# Patient Record
Sex: Male | Born: 1980 | Race: Black or African American | Hispanic: No | Marital: Married | Smoking: Former smoker
Health system: Southern US, Community
[De-identification: ages and names within clinical notes are randomized; demographics above are authoritative.]

## PROBLEM LIST (undated history)

## (undated) DIAGNOSIS — J302 Other seasonal allergic rhinitis: Secondary | ICD-10-CM

## (undated) DIAGNOSIS — T7840XA Allergy, unspecified, initial encounter: Secondary | ICD-10-CM

## (undated) DIAGNOSIS — B019 Varicella without complication: Secondary | ICD-10-CM

## (undated) DIAGNOSIS — R011 Cardiac murmur, unspecified: Secondary | ICD-10-CM

## (undated) DIAGNOSIS — J45909 Unspecified asthma, uncomplicated: Secondary | ICD-10-CM

## (undated) DIAGNOSIS — I10 Essential (primary) hypertension: Secondary | ICD-10-CM

## (undated) DIAGNOSIS — R002 Palpitations: Secondary | ICD-10-CM

## (undated) DIAGNOSIS — R0600 Dyspnea, unspecified: Secondary | ICD-10-CM

## (undated) DIAGNOSIS — R7303 Prediabetes: Secondary | ICD-10-CM

## (undated) DIAGNOSIS — I499 Cardiac arrhythmia, unspecified: Secondary | ICD-10-CM

## (undated) DIAGNOSIS — I671 Cerebral aneurysm, nonruptured: Secondary | ICD-10-CM

## (undated) HISTORY — DX: Dyspnea, unspecified: R06.00

## (undated) HISTORY — DX: Unspecified asthma, uncomplicated: J45.909

## (undated) HISTORY — DX: Essential (primary) hypertension: I10

## (undated) HISTORY — DX: Cardiac murmur, unspecified: R01.1

## (undated) HISTORY — PX: DENTAL SURGERY: SHX609

## (undated) HISTORY — DX: Palpitations: R00.2

## (undated) HISTORY — DX: Cardiac arrhythmia, unspecified: I49.9

## (undated) HISTORY — DX: Prediabetes: R73.03

## (undated) HISTORY — DX: Allergy, unspecified, initial encounter: T78.40XA

## (undated) HISTORY — DX: Varicella without complication: B01.9

## (undated) HISTORY — DX: Cerebral aneurysm, nonruptured: I67.1

---

## 2004-10-04 ENCOUNTER — Emergency Department (HOSPITAL_COMMUNITY): Admission: EM | Admit: 2004-10-04 | Discharge: 2004-10-04 | Payer: Self-pay | Admitting: Emergency Medicine

## 2009-03-09 ENCOUNTER — Emergency Department (HOSPITAL_COMMUNITY): Admission: EM | Admit: 2009-03-09 | Discharge: 2009-03-09 | Payer: Self-pay | Admitting: Emergency Medicine

## 2009-03-12 ENCOUNTER — Emergency Department (HOSPITAL_COMMUNITY): Admission: EM | Admit: 2009-03-12 | Discharge: 2009-03-12 | Payer: Self-pay | Admitting: Emergency Medicine

## 2009-04-20 ENCOUNTER — Emergency Department (HOSPITAL_COMMUNITY): Admission: EM | Admit: 2009-04-20 | Discharge: 2009-04-21 | Payer: Self-pay | Admitting: Emergency Medicine

## 2009-05-09 ENCOUNTER — Ambulatory Visit (HOSPITAL_COMMUNITY): Admission: RE | Admit: 2009-05-09 | Discharge: 2009-05-09 | Payer: Self-pay | Admitting: Urology

## 2010-03-29 IMAGING — CT CT PELVIS W/O CM
2 of 4 series · 17 of 46 positions shown, 19 images · non-contrast
Comparison: None available.

CT ABDOMEN

CLINICAL DATA: Right flank pain, nausea.

CT ABDOMEN AND PELVIS WITHOUT CONTRAST
TECHNIQUE: Multidetector CT imaging of the abdomen and pelvis was
performed following the standard protocol without intravenous
contrast.

[Series 2: stone_wo 5.0 b40f st · axial · 0.74mm/px · z∈[-482,-70]mm · 14 of 113 slices shown, 16 images]
[im 5/113  soft-tissue]
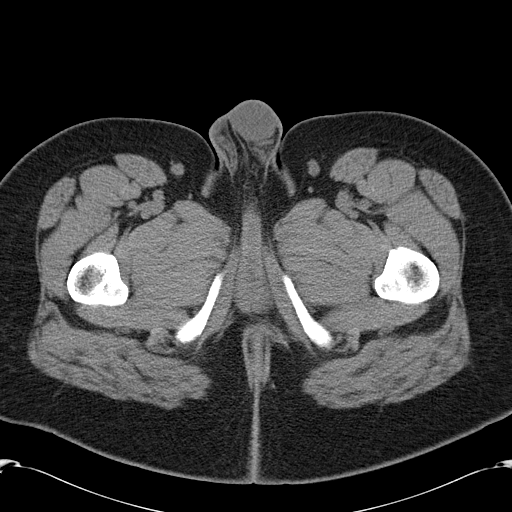
[im 5/113  bone]
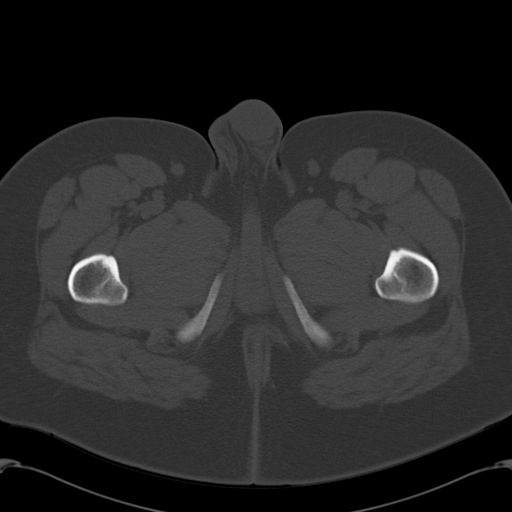
[im 15/113  soft-tissue]
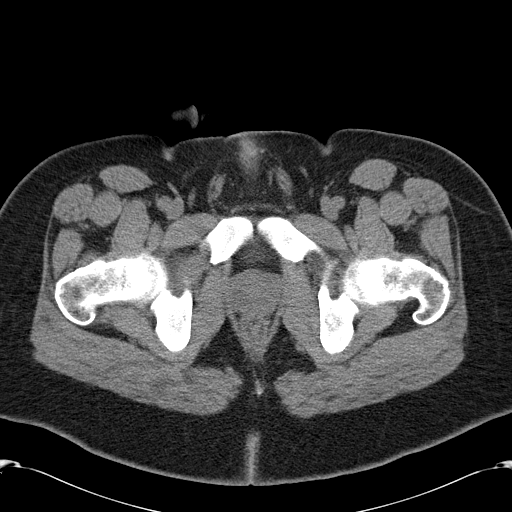
[im 24/113  soft-tissue]
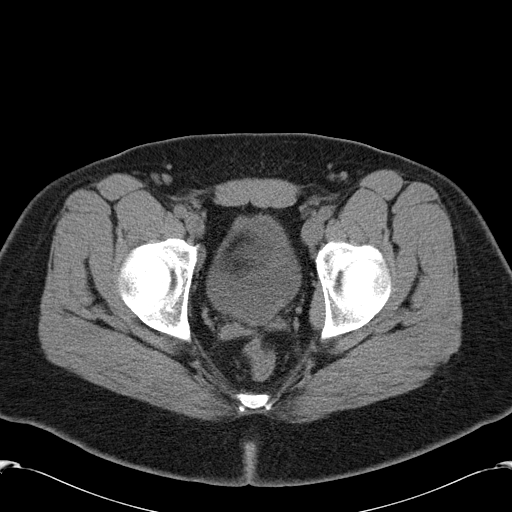
[im 29/113  soft-tissue]
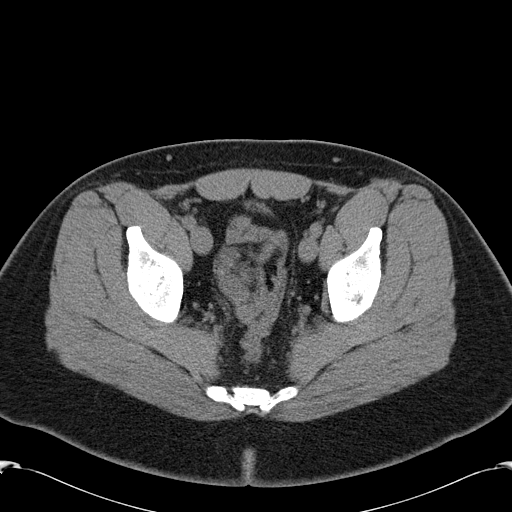
[im 38/113  soft-tissue]
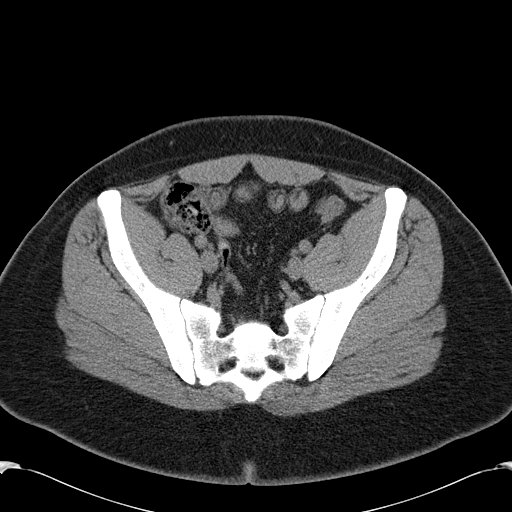
[im 47/113  soft-tissue]
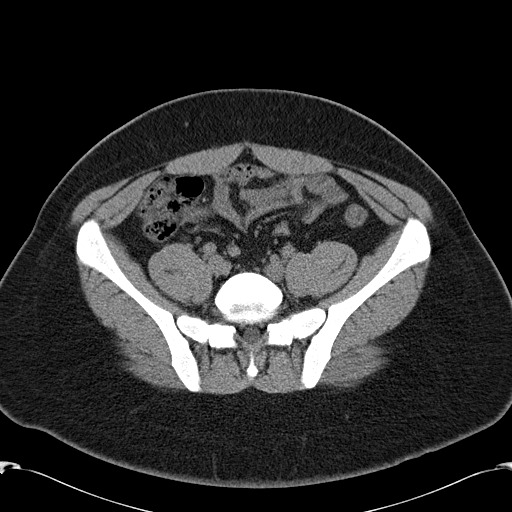
[im 52/113  soft-tissue]
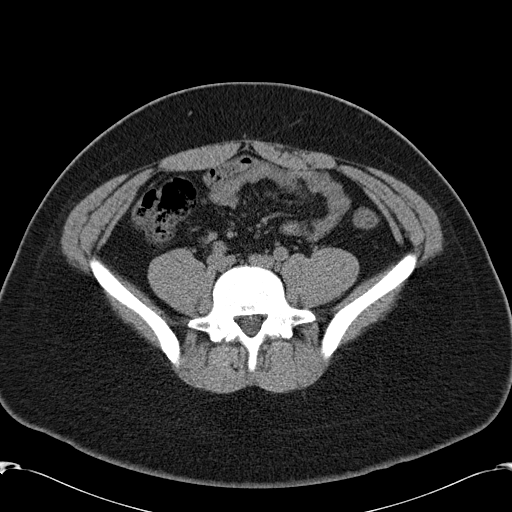
[im 61/113  soft-tissue]
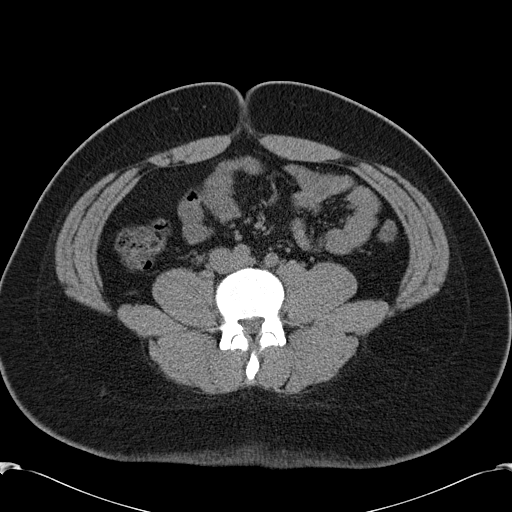
[im 66/113  soft-tissue]
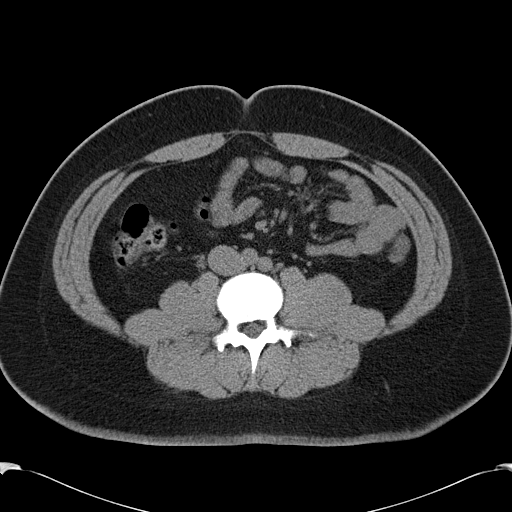
[im 66/113  bone]
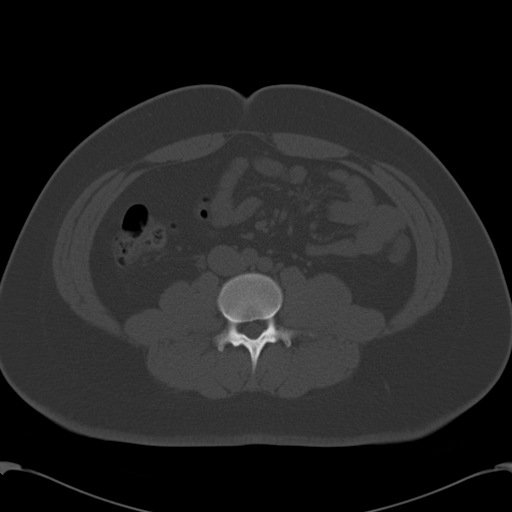
[im 75/113  soft-tissue]
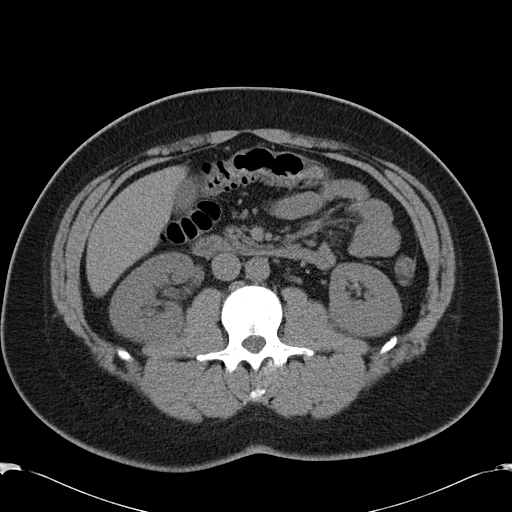
[im 85/113  soft-tissue]
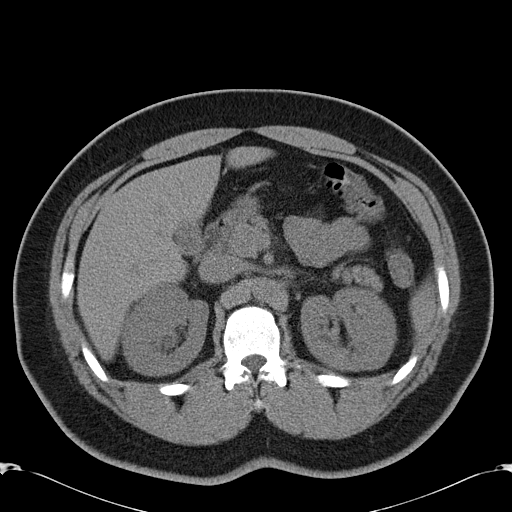
[im 89/113  soft-tissue]
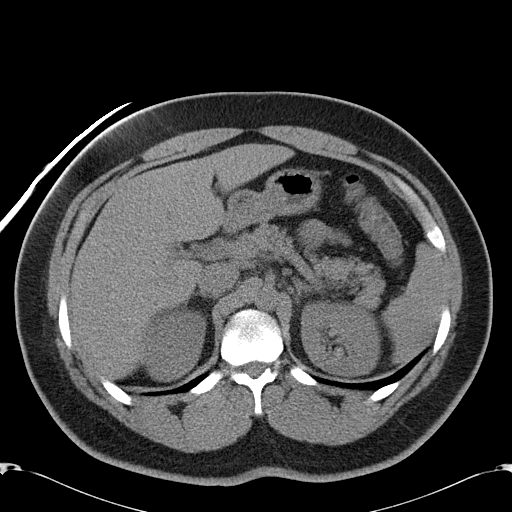
[im 99/113  soft-tissue]
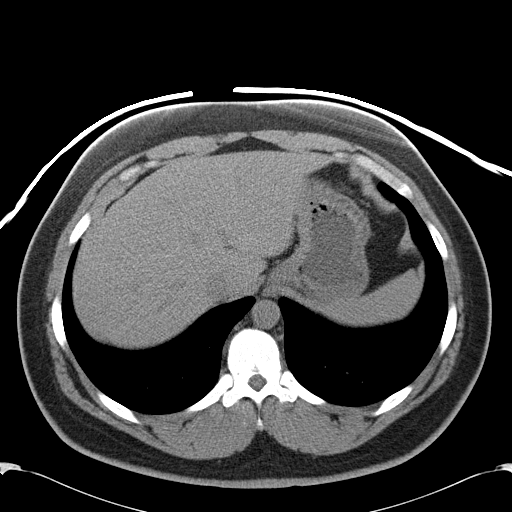
[im 108/113  soft-tissue]
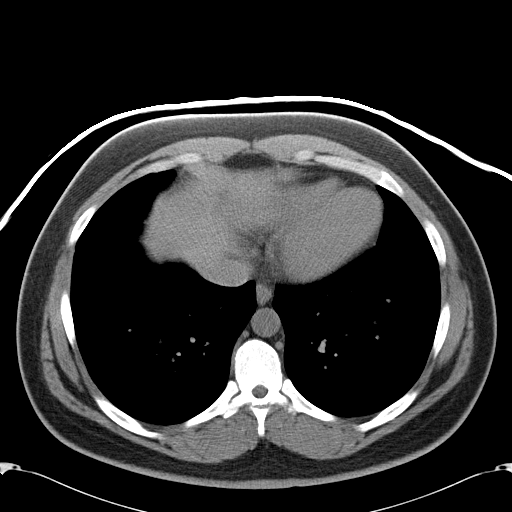

[Series 602: coronal · coronal · 0.92mm/px · 3 of 71 slices shown]
[im 24/71  soft-tissue]
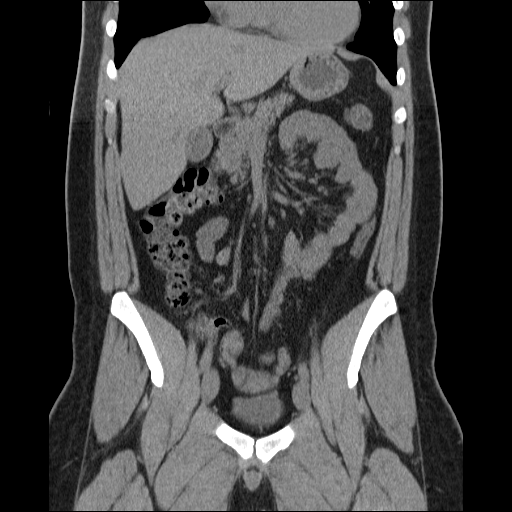
[im 32/71  soft-tissue]
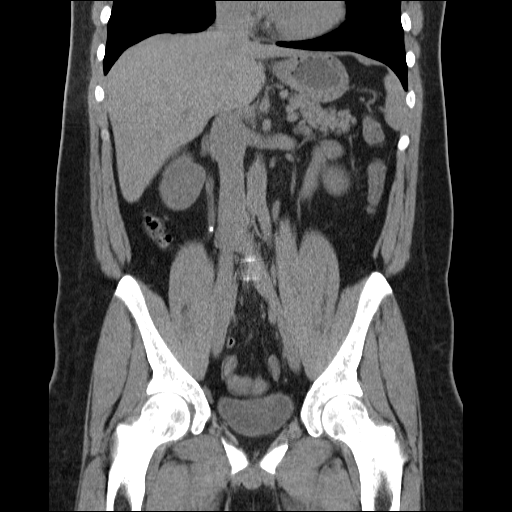
[im 39/71  soft-tissue]
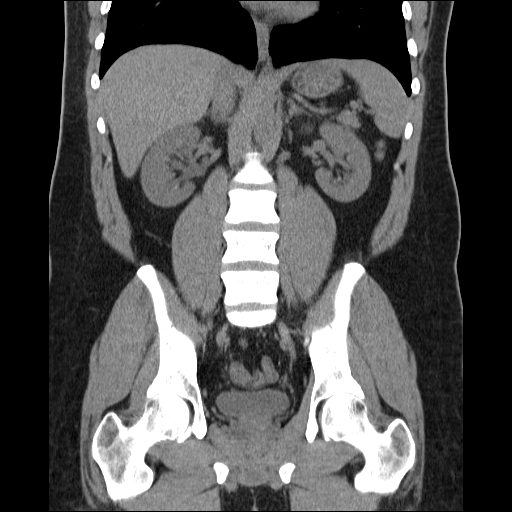

[17 of 46 positions shown; findings below may reference images not displayed]

FINDINGS: The lung bases are clear.  There is no pleural or
pericardial effusion.

There is a stone in the proximal right ureter measuring 0.4 cm.
The stone is at the level of the L3-4 disc.  There is mild right
hydronephrosis.  No other stone is identified on the right or
left.  The patient has a bifid right renal pelvis.

The liver, gallbladder, biliary tree, adrenal glands, spleen and
pancreas all appear normal.  No abdominal lymphadenopathy or fluid.
No focal bony abnormality.
IMPRESSION: Study is positive for a proximal right ureteral stone is associated
mild right hydronephrosis.

CT PELVIS
FINDINGS: No distal ureteral or urinary bladder stones are
identified.  The prostate gland, seminal vesicles and urinary
bladder all appear normal.  The colon and appendix are
unremarkable.  No pelvic fluid or lymphadenopathy.  No focal bony
abnormality.
IMPRESSION: No acute finding in the pelvis.

## 2011-02-09 LAB — CBC
HCT: 42.6 % (ref 39.0–52.0)
Hemoglobin: 14.4 g/dL (ref 13.0–17.0)
MCHC: 33.9 g/dL (ref 30.0–36.0)
MCV: 93.8 fL (ref 78.0–100.0)
Platelets: 249 K/uL (ref 150–400)
RBC: 4.54 MIL/uL (ref 4.22–5.81)
RDW: 13.9 % (ref 11.5–15.5)
WBC: 16.5 K/uL — ABNORMAL HIGH (ref 4.0–10.5)

## 2011-02-09 LAB — URINE MICROSCOPIC-ADD ON

## 2011-02-09 LAB — BASIC METABOLIC PANEL WITH GFR
Calcium: 9.6 mg/dL (ref 8.4–10.5)
GFR calc Af Amer: >60 mL/min (ref >60)
GFR calc non Af Amer: >60 mL/min (ref >60)
Glucose, Bld: 96 mg/dL (ref 70–99)
Potassium: 3.4 meq/L — ABNORMAL LOW (ref 3.5–5.1)
Sodium: 138 meq/L (ref 135–145)

## 2011-02-09 LAB — DIFFERENTIAL
Basophils Absolute: 0 10*3/uL (ref 0.0–0.1)
Basophils Relative: 0 % (ref 0–1)
Eosinophils Absolute: 0.1 K/uL (ref 0.0–0.7)
Eosinophils Relative: 0 % (ref 0–5)
Lymphocytes Relative: 10 % — ABNORMAL LOW (ref 12–46)
Lymphs Abs: 1.7 10*3/uL (ref 0.7–4.0)
Monocytes Absolute: 1.4 10*3/uL — ABNORMAL HIGH (ref 0.1–1.0)
Monocytes Relative: 9 % (ref 3–12)
Neutro Abs: 13.3 10*3/uL — ABNORMAL HIGH (ref 1.7–7.7)
Neutrophils Relative %: 81 % — ABNORMAL HIGH (ref 43–77)

## 2011-02-09 LAB — URINALYSIS, ROUTINE W REFLEX MICROSCOPIC
Bilirubin Urine: NEGATIVE
Glucose, UA: NEGATIVE mg/dL
Hgb urine dipstick: NEGATIVE
Leukocytes, UA: NEGATIVE
Nitrite: NEGATIVE
Protein, ur: 30 mg/dL — AB
Specific Gravity, Urine: 1.03 (ref 1.005–1.030)
Urobilinogen, UA: 1 mg/dL (ref 0.0–1.0)
pH: 8 (ref 5.0–8.0)

## 2011-02-09 LAB — BASIC METABOLIC PANEL
BUN: 10 mg/dL (ref 6–23)
CO2: 26 mEq/L (ref 19–32)
Chloride: 103 mEq/L (ref 96–112)
Creatinine, Ser: 1.32 mg/dL (ref 0.4–1.5)

## 2011-02-09 LAB — LIPASE, BLOOD: Lipase: 21 U/L (ref 11–59)

## 2011-02-10 LAB — URINALYSIS, ROUTINE W REFLEX MICROSCOPIC
Glucose, UA: NEGATIVE mg/dL
Ketones, ur: >80 mg/dL — AB
Nitrite: NEGATIVE
Specific Gravity, Urine: 1.019 (ref 1.005–1.030)
Urobilinogen, UA: 0.2 mg/dL (ref 0.0–1.0)
pH: 6.5 (ref 5.0–8.0)
pH: 8 (ref 5.0–8.0)

## 2011-02-10 LAB — DIFFERENTIAL
Basophils Absolute: 0.1 10*3/uL (ref 0.0–0.1)
Basophils Relative: 1 % (ref 0–1)
Eosinophils Absolute: 0 10*3/uL (ref 0.0–0.7)
Eosinophils Relative: 0 % (ref 0–5)
Lymphocytes Relative: 13 % (ref 12–46)
Lymphs Abs: 1.8 10*3/uL (ref 0.7–4.0)
Neutro Abs: 10.1 10*3/uL — ABNORMAL HIGH (ref 1.7–7.7)
Neutrophils Relative %: 84 % — ABNORMAL HIGH (ref 43–77)

## 2011-02-10 LAB — POCT I-STAT, CHEM 8
HCT: 44 % (ref 39.0–52.0)
Hemoglobin: 15 g/dL (ref 13.0–17.0)
Potassium: 4.2 mEq/L (ref 3.5–5.1)
Sodium: 141 mEq/L (ref 135–145)

## 2011-02-10 LAB — URINE MICROSCOPIC-ADD ON

## 2011-02-10 LAB — CBC
HCT: 43.2 % (ref 39.0–52.0)
Hemoglobin: 15.1 g/dL (ref 13.0–17.0)
MCHC: 34.7 g/dL (ref 30.0–36.0)
MCV: 94.3 fL (ref 78.0–100.0)
Platelets: 245 10*3/uL (ref 150–400)
Platelets: 249 10*3/uL (ref 150–400)
RBC: 4.61 MIL/uL (ref 4.22–5.81)
WBC: 13.1 10*3/uL — ABNORMAL HIGH (ref 4.0–10.5)

## 2011-02-10 LAB — BASIC METABOLIC PANEL
BUN: 9 mg/dL (ref 6–23)
GFR calc Af Amer: >60 mL/min (ref >60)
GFR calc non Af Amer: >60 mL/min (ref >60)
Potassium: 3.5 mEq/L (ref 3.5–5.1)
Sodium: 140 mEq/L (ref 135–145)

## 2014-10-04 ENCOUNTER — Emergency Department (HOSPITAL_COMMUNITY): Payer: BC Managed Care – PPO

## 2014-10-04 ENCOUNTER — Emergency Department (HOSPITAL_COMMUNITY)
Admission: EM | Admit: 2014-10-04 | Discharge: 2014-10-04 | Disposition: A | Discharge status: Home / Self Care | Payer: BC Managed Care – PPO | Attending: Emergency Medicine | Admitting: Emergency Medicine

## 2014-10-04 ENCOUNTER — Encounter (HOSPITAL_COMMUNITY): Payer: Self-pay | Admitting: Emergency Medicine

## 2014-10-04 DIAGNOSIS — Z87891 Personal history of nicotine dependence: Secondary | ICD-10-CM | POA: Diagnosis not present

## 2014-10-04 DIAGNOSIS — Z8679 Personal history of other diseases of the circulatory system: Secondary | ICD-10-CM | POA: Diagnosis not present

## 2014-10-04 DIAGNOSIS — Z87442 Personal history of urinary calculi: Secondary | ICD-10-CM | POA: Diagnosis not present

## 2014-10-04 DIAGNOSIS — R319 Hematuria, unspecified: Secondary | ICD-10-CM | POA: Insufficient documentation

## 2014-10-04 DIAGNOSIS — R52 Pain, unspecified: Secondary | ICD-10-CM

## 2014-10-04 DIAGNOSIS — J45909 Unspecified asthma, uncomplicated: Secondary | ICD-10-CM | POA: Insufficient documentation

## 2014-10-04 DIAGNOSIS — Z79899 Other long term (current) drug therapy: Secondary | ICD-10-CM | POA: Diagnosis not present

## 2014-10-04 DIAGNOSIS — R1030 Lower abdominal pain, unspecified: Secondary | ICD-10-CM | POA: Diagnosis present

## 2014-10-04 DIAGNOSIS — R102 Pelvic and perineal pain: Secondary | ICD-10-CM

## 2014-10-04 LAB — CBC WITH DIFFERENTIAL/PLATELET
BASOS ABS: 0 10*3/uL (ref 0.0–0.1)
BASOS PCT: 0 % (ref 0–1)
EOS PCT: 0 % (ref 0–5)
Eosinophils Absolute: 0 10*3/uL (ref 0.0–0.7)
HEMATOCRIT: 40 % (ref 39.0–52.0)
Hemoglobin: 13.6 g/dL (ref 13.0–17.0)
LYMPHS PCT: 14 % (ref 12–46)
Lymphs Abs: 1.4 10*3/uL (ref 0.7–4.0)
MCH: 30.7 pg (ref 26.0–34.0)
MCHC: 34 g/dL (ref 30.0–36.0)
MCV: 90.3 fL (ref 78.0–100.0)
MONO ABS: 0.5 10*3/uL (ref 0.1–1.0)
Monocytes Relative: 5 % (ref 3–12)
Neutro Abs: 8.1 10*3/uL — ABNORMAL HIGH (ref 1.7–7.7)
Neutrophils Relative %: 81 % — ABNORMAL HIGH (ref 43–77)
Platelets: 221 10*3/uL (ref 150–400)
RBC: 4.43 MIL/uL (ref 4.22–5.81)
RDW: 13.7 % (ref 11.5–15.5)
WBC: 10 10*3/uL (ref 4.0–10.5)

## 2014-10-04 LAB — URINALYSIS, ROUTINE W REFLEX MICROSCOPIC
BILIRUBIN URINE: NEGATIVE
Glucose, UA: NEGATIVE mg/dL
KETONES UR: 15 mg/dL — AB
LEUKOCYTES UA: NEGATIVE
NITRITE: NEGATIVE
PROTEIN: NEGATIVE mg/dL
Specific Gravity, Urine: 1.02 (ref 1.005–1.030)
UROBILINOGEN UA: 1 mg/dL (ref 0.0–1.0)
pH: 7.5 (ref 5.0–8.0)

## 2014-10-04 LAB — COMPREHENSIVE METABOLIC PANEL
ALT: 17 U/L (ref 0–53)
ANION GAP: 12 (ref 5–15)
AST: 20 U/L (ref 0–37)
Albumin: 4 g/dL (ref 3.5–5.2)
Alkaline Phosphatase: 70 U/L (ref 39–117)
BILIRUBIN TOTAL: 0.9 mg/dL (ref 0.3–1.2)
BUN: 14 mg/dL (ref 6–23)
CALCIUM: 9.3 mg/dL (ref 8.4–10.5)
CHLORIDE: 105 meq/L (ref 96–112)
CO2: 25 mEq/L (ref 19–32)
CREATININE: 0.91 mg/dL (ref 0.50–1.35)
GFR calc Af Amer: >90 mL/min (ref >90)
GFR calc non Af Amer: >90 mL/min (ref >90)
Glucose, Bld: 109 mg/dL — ABNORMAL HIGH (ref 70–99)
Potassium: 4 mEq/L (ref 3.7–5.3)
Sodium: 142 mEq/L (ref 137–147)
TOTAL PROTEIN: 7.8 g/dL (ref 6.0–8.3)

## 2014-10-04 LAB — URINE MICROSCOPIC-ADD ON

## 2014-10-04 MED ORDER — ONDANSETRON HCL 4 MG/2ML IJ SOLN
4.0000 mg | Freq: Once | INTRAMUSCULAR | Status: AC
  Administered 2014-10-04: 4 mg via INTRAVENOUS
  Filled 2014-10-04: qty 2

## 2014-10-04 MED ORDER — IOHEXOL 300 MG/ML  SOLN
100.0000 mL | Freq: Once | INTRAMUSCULAR | Status: AC | PRN
  Administered 2014-10-04: 100 mL via INTRAVENOUS

## 2014-10-04 MED ORDER — IBUPROFEN 800 MG PO TABS: 800.0000 mg | ORAL_TABLET | Freq: Three times a day (TID) | ORAL | Status: DC

## 2014-10-04 MED ORDER — SODIUM CHLORIDE 0.9 % IV BOLUS (SEPSIS)
1000.0000 mL | Freq: Once | INTRAVENOUS | Status: AC
  Administered 2014-10-04: 1000 mL via INTRAVENOUS

## 2014-10-04 MED ORDER — IOHEXOL 300 MG/ML  SOLN
50.0000 mL | Freq: Once | INTRAMUSCULAR | Status: AC | PRN
  Administered 2014-10-04: 50 mL via ORAL

## 2014-10-04 MED ORDER — HYDROMORPHONE HCL 1 MG/ML IJ SOLN
0.5000 mg | Freq: Once | INTRAMUSCULAR | Status: AC
Start: 2014-10-04 — End: 2014-10-04
  Administered 2014-10-04: 0.5 mg via INTRAVENOUS
  Filled 2014-10-04: qty 1

## 2014-10-04 NOTE — ED Provider Notes (Signed)
CSN: [id number]     Arrival date & time 10/04/14  [id number] History   First MD Initiated Contact with Patient 10/04/14 0449     Chief Complaint  Patient presents with  . Abdominal Pain     (Consider location/radiation/quality/duration/timing/severity/associated sxs/prior Treatment) HPI Mr. Joshua Galloway is a 33 year old male with past medical history of asthma, previous kidney stones who presents the ER complaining of abdominal pain. Patient states his pain began acutely around 1:00 AM this morning. Patient states pain has been "off and on" since then. He describes his pain in his lower abdomen, does not radiate. Patient states his pain was more severe last night, and has since lessened in severity. Patient states right now during the interview his pain as a 4 out of 10. Patient reports one episode of left-sided flank pain throughout the night as well. Patient reports mild nausea throughout the night, however denies vomiting. Patient denies associated chest pain, shortness of breath, dizziness, weakness, diarrhea, dysuria, testicular pain, testicular swelling.  Past Medical History  Diagnosis Date  . Irregular heart rate   . Asthma   . Palpitations   . Chest pain   . Dyspnea    History reviewed. No pertinent past surgical history. Family History  Problem Relation Age of Onset  . Heart attack Father    History  Substance Use Topics  . Smoking status: Former Smoker    Quit date: 03/25/2009  . Smokeless tobacco: Not on file  . Alcohol Use: Yes     Comment: rarely     Review of Systems  Constitutional: Negative for fever.  HENT: Negative for trouble swallowing.   Eyes: Negative for visual disturbance.  Respiratory: Negative for shortness of breath.   Cardiovascular: Negative for chest pain.  Gastrointestinal: Positive for nausea and abdominal pain. Negative for vomiting.  Genitourinary: Negative for dysuria, discharge, penile swelling, scrotal swelling, penile pain and testicular  pain.  Musculoskeletal: Negative for neck pain.  Skin: Negative for rash.  Neurological: Negative for dizziness, weakness and numbness.  Psychiatric/Behavioral: Negative.       Allergies  Review of patient's allergies indicates no known allergies.  Home Medications   Prior to Admission medications   Medication Sig Start Date End Date Taking? Authorizing Provider  acetaminophen (TYLENOL) 325 MG tablet Take 325 mg by mouth every 6 (six) hours as needed for headache.   Yes Historical Provider, MD  Ephedrine-Guaifenesin (PRIMATENE ASTHMA) 12.5-200 MG TABS Take 2 tablets by mouth daily as needed (SOB).   Yes Historical Provider, MD  OVER THE COUNTER MEDICATION Take 1 each by mouth daily.   Yes Historical Provider, MD  ibuprofen (ADVIL,MOTRIN) 800 MG tablet Take 1 tablet (800 mg total) by mouth 3 (three) times daily. 10/04/14   Courtney A Forcucci, PA-C   BP 125/72 mmHg  Pulse 78  Temp(Src) 97.5 F (36.4 C) (Oral)  Resp 16  Ht 5\' 11"  (1.803 m)  Wt 230 lb (104.327 kg)  BMI 32.09 kg/m2  SpO2 96% Physical Exam  Constitutional: He is oriented to person, place, and time. He appears well-developed and well-nourished. No distress.  HENT:  Head: Normocephalic and atraumatic.  Mouth/Throat: Oropharynx is clear and moist. No oropharyngeal exudate.  Eyes: Right eye exhibits no discharge. Left eye exhibits no discharge. No scleral icterus.  Neck: Normal range of motion.  Cardiovascular: Normal rate, regular rhythm and normal heart sounds.   No murmur heard. Pulmonary/Chest: Effort normal and breath sounds normal. No respiratory distress.  Abdominal: Soft. Normal appearance and  bowel sounds are normal. There is tenderness in the suprapubic area. There is no rigidity, no guarding, no tenderness at McBurney's point and negative Murphy's sign.  Mild suprapubic tenderness right greater than left.  Genitourinary: Testes normal and penis normal. Right testis shows no mass, no swelling and no  tenderness. Right testis is descended. Cremasteric reflex is not absent on the right side. Left testis shows no mass, no swelling and no tenderness. Left testis is descended. Cremasteric reflex is not absent on the left side.  Musculoskeletal: Normal range of motion. He exhibits no edema or tenderness.  Neurological: He is alert and oriented to person, place, and time. No cranial nerve deficit. Coordination normal. GCS eye subscore is 4. GCS verbal subscore is 5. GCS motor subscore is 6.  Patient fully alert answering questions appropriately in full, clear sentences. Motor strength 5 out of 5 in all major muscle groups of upper and lower extremity. Cranial nerves II through XII grossly intact. Distal sensation intact.  Skin: Skin is warm and dry. No rash noted. He is not diaphoretic.  Psychiatric: He has a normal mood and affect.    ED Course  Procedures (including critical care time) Labs Review Labs Reviewed  CBC WITH DIFFERENTIAL - Abnormal; Notable for the following:    Neutrophils Relative % 81 (*)    Neutro Abs 8.1 (*)    All other components within normal limits  URINALYSIS, ROUTINE W REFLEX MICROSCOPIC - Abnormal; Notable for the following:    Hgb urine dipstick LARGE (*)    Ketones, ur 15 (*)    All other components within normal limits  COMPREHENSIVE METABOLIC PANEL - Abnormal; Notable for the following:    Glucose, Bld 109 (*)    All other components within normal limits  URINE MICROSCOPIC-ADD ON    Imaging Review Ct Abdomen Pelvis W Contrast  10/04/2014   CLINICAL DATA:  Right lower quadrant pain  EXAM: CT ABDOMEN AND PELVIS WITH CONTRAST  TECHNIQUE: Multidetector CT imaging of the abdomen and pelvis was performed using the standard protocol following bolus administration of intravenous contrast.  CONTRAST:  OMNIPAQUE IOHEXOL 300 MG/ML  SOLN  COMPARISON:  CT abdomen pelvis 04/20/2009  FINDINGS: Lung bases are clear.  Heart size is normal.  8 mm hypodensity in the left  lobe liver is stable from the prior study. No other liver lesion. Gallbladder and bile ducts are normal. Pancreas and spleen are normal.  Normal kidneys. No renal obstruction or mass. No urinary tract calculi. Urine bladder normal. Mild prostate enlargement.  Negative for bowel obstruction. No bowel thickening. Appendix normal. Scattered ileocolic lymph nodes are present measuring up to 10 mm in diameter. Negative for diverticulitis. Mild sigmoid diverticulosis.  IMPRESSION: Normal appendix.  Mild diverticulosis  Mild ileocolic  adenopathy.   Electronically Signed   By: Marlan Palau M.D.   On: 10/04/2014 07:18     EKG Interpretation None      MDM   Final diagnoses:  Suprapubic pain  Pain  Hematuria   Workup for suprapubic pain. Patient reporting he has had kidney stones in the past, however patient's pain is suprapubic and mildly reproducible. Patient's last CT abdomen pelvis was in 2010, we will repeat CT abdomen and pelvis today due to the nature of patient's pain and the length of time since his last one. Patient stating the pain he experienced with his kidney stones is much more severe. Patient nontoxic, well-appearing, and in no acute distress. Patient states his pain is  a 4 out of 10.  Hematuria noted on UA. No leukocytosis, anemia, electrolyte abnormality. Renal function within normal limits. Will follow-up with CT abdomen pelvis due to mild right lower quadrant abdominal tenderness. No concern for testicular torsion, testicular pathology contributing to patient's lower abdominal pain. GU exam benign.  7:00 AM: Plan to f/u on CT abd pelv.  Pt signed out do Delta Air LinesCourtney Forcucci, PA-C.    BP 125/72 mmHg  Pulse 78  Temp(Src) 97.5 F (36.4 C) (Oral)  Resp 16  Ht 5\' 11"  (1.803 m)  Wt 230 lb (104.327 kg)  BMI 32.09 kg/m2  SpO2 96%  Signed,  Ladona MowJoe Lauralee Waters, PA-C 6:43 AM   Monte FantasiaJoseph W Sargent Mankey, PA-C 10/05/14 [id number]  Doug SouSam Jacubowitz, MD 10/05/14 [phone]

## 2014-10-04 NOTE — Discharge Instructions (Signed)
Abdominal Pain Many things can cause belly (abdominal) pain. Most times, the belly pain is not dangerous. Many cases of belly pain can be watched and treated at home. HOME CARE   Do not take medicines that help you go poop (laxatives) unless told to by your doctor.  Only take medicine as told by your doctor.  Eat or drink as told by your doctor. Your doctor will tell you if you should be on a special diet. GET HELP IF:  You do not know what is causing your belly pain.  You have belly pain while you are sick to your stomach (nauseous) or have runny poop (diarrhea).  You have pain while you pee or poop.  Your belly pain wakes you up at night.  You have belly pain that gets worse or better when you eat.  You have belly pain that gets worse when you eat fatty foods.  You have a fever. GET HELP RIGHT AWAY IF:   The pain does not go away within 2 hours.  You keep throwing up (vomiting).  The pain changes and is only in the right or left part of the belly.  You have bloody or tarry looking poop. MAKE SURE YOU:   Understand these instructions.  Will watch your condition.  Will get help right away if you are not doing well or get worse. Document Released: 04/06/2008 Document Revised: 10/24/2013 Document Reviewed: 06/28/2013 [facility] Patient Information 2015 [place], [place]. This information is not intended to replace advice given to you by your health care provider. Make sure you discuss any questions you have with your health care provider.  Hematuria Hematuria is blood in your urine. It can be caused by a bladder infection, kidney infection, prostate infection, kidney stone, or cancer of your urinary tract. Infections can usually be treated with medicine, and a kidney stone usually will pass through your urine. If neither of these is the cause of your hematuria, further workup to find out the reason may be needed. It is very important that you tell your health care provider  about any blood you see in your urine, even if the blood stops without treatment or happens without causing pain. Blood in your urine that happens and then stops and then happens again can be a symptom of a very serious condition. Also, pain is not a symptom in the initial stages of many urinary cancers. HOME CARE INSTRUCTIONS   Drink lots of fluid, 3-4 quarts a day. If you have been diagnosed with an infection, cranberry juice is especially recommended, in addition to large amounts of water.  Avoid caffeine, tea, and carbonated beverages because they tend to irritate the bladder.  Avoid alcohol because it may irritate the prostate.  Take all medicines as directed by your health care provider.  If you were prescribed an antibiotic medicine, finish it all even if you start to feel better.  If you have been diagnosed with a kidney stone, follow your health care provider's instructions regarding straining your urine to catch the stone.  Empty your bladder often. Avoid holding urine for long periods of time.  After a bowel movement, women should cleanse front to back. Use each tissue only once.  Empty your bladder before and after sexual intercourse if you are a male. SEEK MEDICAL CARE IF:  You develop back pain.  You have a fever.  You have a feeling of sickness in your stomach (nausea) or vomiting.  Your symptoms are not better in 3 days.  Return sooner if you are getting worse. SEEK IMMEDIATE MEDICAL CARE IF:   You develop severe vomiting and are unable to keep the medicine down.  You develop severe back or abdominal pain despite taking your medicines.  You begin passing a large amount of blood or clots in your urine.  You feel extremely weak or faint, or you pass out. MAKE SURE YOU:   Understand these instructions.  Will watch your condition.  Will get help right away if you are not doing well or get worse. Document Released: 10/19/2005 Document Revised: 03/05/2014  Document Reviewed: 06/19/2013 [facility] Patient Information 2015 [place], [place]. This information is not intended to replace advice given to you by your health care provider. Make sure you discuss any questions you have with your health care provider.    Emergency Department Resource Guide 1) Find a Doctor and Pay Out of Pocket Although you won't have to find out who is covered by your insurance plan, it is a good idea to ask around and get recommendations. You will then need to call the office and see if the doctor you have chosen will accept you as a new patient and what types of options they offer for patients who are self-pay. Some doctors offer discounts or will set up payment plans for their patients who do not have insurance, but you will need to ask so you aren't surprised when you get to your appointment.  2) Contact Your Local Health Department Not all health departments have doctors that can see patients for sick visits, but many do, so it is worth a call to see if yours does. If you don't know where your local health department is, you can check in your phone book. The CDC also has a tool to help you locate your state's health department, and many state websites also have listings of all of their local health departments.  3) Find a Walk-in Clinic If your illness is not likely to be very severe or complicated, you may want to try a walk in clinic. These are popping up all over the country in pharmacies, drugstores, and shopping centers. They're usually staffed by nurse practitioners or physician assistants that have been trained to treat common illnesses and complaints. They're usually fairly quick and inexpensive. However, if you have serious medical issues or chronic medical problems, these are probably not your best option.  No Primary Care Doctor: - Call Health Connect at  [phone] - they can help you locate a primary care doctor that  accepts your insurance, provides certain  services, etc. - Physician Referral Service- [phone]  Chronic Pain Problems: Organization         Address  Phone   Notes  [facility]  [phone] Patients need to be referred by their primary care doctor.   Medication Assistance: Organization         Address  Phone   Notes  [facility] [address] [place], [place] [address] [phone]) [phone] --Must be a resident of [facility] -- Must have NO insurance coverage whatsoever (no Medicaid/ Medicare, etc.) -- The pt. MUST have a primary care doctor that directs their care regularly and follows them in the community   MedAssist  [phone]   [employer]  [phone]    Agencies that provide inexpensive medical care: Organization         Address  Phone   Notes  [facility]  (  [phone]   [facility]    [phone]   [facility] [address] [place], [place] [phone]   [facility] [address] [place], [place] [phone]   Planned Parenthood    [phone]   [facility]    [phone]   [facility]  [address], [place] Phone:  [phone], Fax:  [phone] Hours of Operation:  9 am - 6 pm, M-F.  Also accepts Medicaid/Medicare and self-pay.  [facility]  [address], [place] Phone: [phone], Fax: [phone]. Hours of Operation:  8:30 am - 5:30 pm, M-F.  Also accepts Medicaid and self-pay.  [facility] [place] [address] [place] Phone: [phone]   [facility] [address] Natasha Bence [place], [place] [phone], Ext. [phone] Mondays & Thursdays: 7-9 AM.  First 15 patients are seen on a first come, first serve basis.    Medicaid-accepting [facility] Providers:  Organization         Address  Phone   Notes  [facility] [address], [place] [phone] Also accepts self-pay patients.  [facility] [address] Laurell Josephs [place], [place]  [phone]   [facility] [address], [place] [phone]   [facility] [address] [place] [phone]   Renaye Rakers [address], [place]   [phone] Only accepts [place] patients after they have their name applied to their card.   Self-Pay (no insurance) in [facility]:  Organization         Address  Phone   Notes  Sickle Cell Patients, [facility] [address] [place], [place] [phone]   [facility] [address] [place] [place], [place] [phone]   [facility] [place]  [address], [place] [phone]   [facility]/Dr. Osei-Bonsu  [address], [place] or [address], [place] [phone] Phone number for both [place] and [place] locations is the same.  [facility] [address] [place] [phone]   [facility] [address] [place] or [address] [phone]   [facility] [address], [place] [phone], phone; [phone], fax Sees patients 1st and 3rd Saturday of every month.  Must not qualify for public or private insurance (i.e. Medicaid, Medicare, [place] Health Choice, Veterans' Benefits)  Household income should be no more than 200% of the poverty level The clinic cannot treat you if you are pregnant or think you are pregnant  Sexually transmitted diseases are not treated at the clinic.    Dental Care: Organization         Address  Phone  Notes  [facility] of [facility] [address] [place], [place] [phone] Accepts  children up to age 14 who are enrolled in [place] or [place] Health Choice; pregnant women with a Medicaid card; and children who have applied for Medicaid or [place] Health Choice, but were declined, whose parents can pay a reduced fee at time of service.  [facility] Department of [employer]  [address], [place] [phone]) [phone] Accepts children up to age 34 who are enrolled in Medicaid or [place] Health Choice; pregnant women with a Medicaid card; and children who have applied for Medicaid or [place] Health Choice, but were declined, whose parents can pay a reduced fee at time of service.  [facility]  [address], [place] [phone] Patients are seen by appointment only. Walk-ins are not accepted. [facility] will see patients 34 years of age and older. Monday - Tuesday (8am-5pm) Most Wednesdays (8:30-5pm) $30 per visit, cash only  [facility]  [address], [facility] [place] [phone]) [phone] Patients are seen by appointment only. Walk-ins are not accepted. [facility] will see patients 34 years of age and older. One Wednesday Evening (Monthly: Volunteer Based).  $30 per visit, cash only  [employer] [facility] [facility]  [phone] for adults; Children under age 204, call Graduate Pediatric Dentistry at [phone]. Children aged 374-14, please call [phone] to request a pediatric application.  Dental services are provided in all areas of dental care including fillings, crowns and bridges, complete and partial dentures, implants, gum treatment, root canals, and extractions. Preventive care is also provided. Treatment is provided to both adults and children. Patients are selected via a lottery and there is often a waiting list.   [facility] [address], [place]  [phone]) [phone] [email or web address]   [facility] [address], [place], [place] [phone], Ext. [phone] Second and  Fourth Thursday of each month, opens at 6:30 AM; Clinic ends at 9 AM.  Patients are seen on a first-come first-served basis, and a limited number are seen during each clinic.   [facility]  [address], [place], [place] [phone]   Eligibility Requirements You must have lived in [place], [place], or [place] counties for at least the last three months.   You cannot be eligible for state or federal sponsored [place] Cityhealthcare insurance, including CIGNAVeterans Administration, [place], or [employer]Medicare.   You generally cannot be eligible for healthcare insurance through your employer.    How to apply: Eligibility screenings are held every Tuesday and Wednesday afternoon from 1:00 pm until 4:00 pm. You do not need an appointment for the interview!  [facility] [address], [place], [place] [phone]   [facility]  [phone]   [facility]  [phone]   [facility]  [phone]    Behavioral Health Resources in the Community: Intensive Outpatient Programs Organization         Address  Phone  Notes  [facility] [address], [place], [place] [phone]   [facility] [address], [place], [place] [phone]   [facility] [address], [place], [place]  [phone]   [facility] [address],  [place], [place] [phone] or [phone]   Substance Abuse Resources Organization         Address  Phone  Notes  [facility]  [phone]   [facility]  [phone]   [facility]  [phone]   [facility]  [phone]   Residential & Outpatient Substance Abuse Program  [phone]   Psychological Services Organization         Address  Phone  Notes  [facility]  [phone]- [phone]   [facility]  Services  [phone]   [facility]  [address], [place] [phone] or [phone]    Mobile Crisis Teams Organization         Address  Phone  Notes  [facility], Mobile Crisis Care Unit  [phone]   [facility]  [address] [place], [place] [phone]   Doristine Locks [address] [place] [phone]    Self-Help/Support Groups Organization         Address  Phone             Notes  [facility]oc[facility] of [place] - variety of support groups  [phone]- [phone] Call for more information  Narcotics Anonymous (NA), [facility] [address], [place]  2 meetings at this location   Statistician         Address  Phone  Notes  [facility] [address],    [place]  [phone]   [facility]  [address] [place] [address], [place], [place] [phone]   [facility] [address] [place], [place] [phone] Admissions: 8am-3pm M-F  [facility] [address],    [place], [place] [phone]   [facility] [address] [place], [place], [place] [phone]   [facility] [address]  [place], [place] [phone]   [facility] - Intensive Outpatient [address]ure[address] Joseph[address], [place], [place] [phone]   [facility] ([facility] [address] [place]  [place], [place] [phone] or [phone]   [facility] ([facility]) [address] [place], [place] [phone] Accepts Medicaid  [facility] [address],  [place] [phone] Substance Abuse/Addiction Treatment   [facility] Organization         Address  Phone  Notes  [facility]  [phone]   Angie Fava, PhD [address] Ervin Knack [place], [place]   [phone] or [phone]   [facility] Behavioral   [address] [place], [place] [phone]   [facility] [address], [place], [place] [phone] Insurance/Medicaid/sponsorship through [facility] and Families [address]                                    [place], [place] [phone] Therapy/tele-psych/case  [facility] [address], [place] [phone]    Dr. Lolly Mustache  [phone]   [facility] [place]  [employer] [facility] Dept. 1) [address] [place] 2) [address], [place] 3)  [address], [place] [phone]  [phone]   [facility] Child Abuse Hotline [phone] or [phone] (After Hours)

## 2014-10-04 NOTE — ED Notes (Signed)
Patient transported to CT 

## 2014-10-04 NOTE — ED Provider Notes (Signed)
Patient is a 33 year old male who presents emergency room for evaluation of suprapubic abdominal pain that is intermittent and mild. Patient was signed out to me at shift change. Patient was awaiting CT scan of the abdomen and pelvis with contrast to look for possible appendicitis or kidney stones. Patient is comfortable in the room and is not asking for pain medication at this time.  Physical Exam  BP 138/77 mmHg  Pulse 79  Temp(Src) 97.5 F (36.4 C) (Oral)  Resp 16  Ht 5\' 11"  (1.803 m)  Wt 230 lb (104.327 kg)  BMI 32.09 kg/m2  SpO2 95%  Physical Exam  Constitutional: He is oriented to person, place, and time. He appears well-developed and well-nourished. No distress.  HENT:  Head: Normocephalic and atraumatic.  Mouth/Throat: Oropharynx is clear and moist. No oropharyngeal exudate.  Eyes: Conjunctivae and EOM are normal. Pupils are equal, round, and reactive to light. No scleral icterus.  Neck: Normal range of motion. Neck supple. No JVD present. No thyromegaly present.  Cardiovascular: Normal rate, regular rhythm, normal heart sounds and intact distal pulses.  Exam reveals no gallop and no friction rub.   No murmur heard. Pulmonary/Chest: Effort normal and breath sounds normal. No respiratory distress. He has no wheezes. He has no rales. He exhibits no tenderness.  Abdominal: Soft. Bowel sounds are normal. He exhibits no distension and no mass. There is no tenderness. There is no rebound and no guarding.  Musculoskeletal: Normal range of motion.  Lymphadenopathy:    He has no cervical adenopathy.  Neurological: He is alert and oriented to person, place, and time.  Skin: Skin is warm and dry. He is not diaphoretic.  Psychiatric: He has a normal mood and affect. His behavior is normal. Judgment and thought content normal.  Nursing note and vitals reviewed.   ED Course  Procedures  MDM Patient is a 33 year old male who presents emergency room for evaluation of abdominal pain.  Patient was signed out to me at shift change. CT scan of the abdomen and pelvis is negative for acute abnormalities. Patient does have diverticulosis. Other than hematuria labs are unremarkable here in the ED. I will discharge the patient home with 800 mg of ibuprofen for pain as needed. Patient is to follow-up with urology to assure that hematuria resolves. Patient states understanding and agreement at this time. Patient is to return for worsening pain, intractable nausea and vomiting, or difficulty urinating. Patient states understanding and agreement at this time. Patient is stable for discharge.      Eben Burowourtney A Forcucci, PA-C 10/04/14 [id number]  Tilden FossaElizabeth Rees, MD 10/04/14 [phone]

## 2014-10-04 NOTE — ED Notes (Signed)
Pt alert and oriented x4. Respirations even and unlabored, bilateral symmetrical rise and fall of chest. Skin warm and dry. In no acute distress. Denies needs.   

## 2014-10-04 NOTE — ED Provider Notes (Signed)
Complains of right-sided suprapubic pain onset upon awaking 3 AM today he also complained of left flank pain onset at 3 AM which has resolved without treatment. Pain is mild presently located at right suprapubic area. No urinary symptoms no fever no nausea or vomiting. On exam alert no distress abdomen nondistended normoactive bowel sounds tender mildly at right lower quadrant and right suprapubic area. No flank tenderness. Genitalia normal male no testicular tenderness no signs of hernia. Patient declines pain medicine presently  Doug SouSam Dominica [place], MD 10/04/14 [phone]

## 2014-10-04 NOTE — ED Notes (Signed)
Pt c/o constant aching to lower abdomen onset 0100, +nausea

## 2017-10-19 ENCOUNTER — Ambulatory Visit: Payer: Self-pay | Admitting: Cardiology

## 2019-09-14 ENCOUNTER — Ambulatory Visit
Admission: RE | Admit: 2019-09-14 | Discharge: 2019-09-14 | Disposition: A | Discharge status: Home / Self Care | Payer: Self-pay | Source: Ambulatory Visit | Attending: Emergency Medicine | Admitting: Emergency Medicine

## 2019-09-14 ENCOUNTER — Other Ambulatory Visit: Payer: Self-pay

## 2019-09-14 DIAGNOSIS — R220 Localized swelling, mass and lump, head: Secondary | ICD-10-CM | POA: Insufficient documentation

## 2019-09-14 HISTORY — DX: Other seasonal allergic rhinitis: J30.2

## 2019-09-14 NOTE — Discharge Instructions (Signed)
Take aleve, 2 pills, 2 times a day with meals.  Use a warm compress 3 times a day.  Continue taking the keflex.  Follow up with the ear nose and throat doctor if your symptoms are not improving.  They may want to send you to dermatology instead, the information is listed below.  Return to the ER or for an in person visit if you develop fevers, severe pain, pain behind your eyes, difficulty moving your eyes, or any new, worsening, or concerning symptoms.

## 2019-09-14 NOTE — ED Provider Notes (Signed)
[facility] VIDEO VISIT Provider Note   CSN: [id number] Arrival date & time: 09/14/19  6144     History   Chief Complaint Chief Complaint  Patient presents with  . Facial Swelling     Patient/guardian understands the limitations in scope of a virtual visit: Yes  Patient/guardian provides consent to proceed with a virtual visit: Yes    HPI Joshua Galloway is a 38 y.o. male presenting for evaluation of facial swelling.  Patient states 6 days ago he woke up to notice swelling in the area between his eyebrows.  He states it is slightly erythematous.  He had a virtual visit on Monday, 5 days ago, and was started on Keflex.  He has been taking as prescribed, but reports no improvement.  He states it is mildly itchy, but no drainage, pain, or induration.  He states this morning his right eye was itchy with clear drainage.  He also reports associated periauricular and submandibular lymph node swelling which he reports is palpable.  He denies nasal congestion, rhinorrhea, sore throat, fevers, chills, eye pain, ear pain.  He denies cough or shortness of breath.  He denies sick contacts.  He has a history of seasonal allergies, takes Benadryl as needed.  He took this 2 days ago without improvement.  He also took 1 dose of Aleve once a day for the past several days without improvement of symptoms.  He denies history of similar illness.  He is being seen for recheck, as things are not improving.      HPI  Past Medical History:  Diagnosis Date  . Asthma   . Chest pain   . Dyspnea   . Irregular heart rate   . Palpitations   . Seasonal allergies     Patient's medical history was reviewed, including any previous history documented in Epic. Patient doesn't have any of the following conditions: ESRD/ESLD/HIV-AIDS/Severe heart or lung disease requiring oxygen/advanced dementia, active cancer.    There are no active problems to display for this patient.   No past surgical history on  file.      Home Medications    Prior to Admission medications   Medication Sig Start Date End Date Taking? Authorizing Provider  cephALEXin (KEFLEX) 250 MG capsule Take 250 mg by mouth 4 (four) times daily. 09/11/19 09/21/19 Yes [provider]  diphenhydrAMINE (BENADRYL) 25 mg capsule Take 25 mg by mouth every 6 (six) hours as needed for allergies.   Yes [provider]  naproxen sodium (ALEVE) 220 MG tablet Take 220 mg by mouth daily as needed.   Yes [provider]  acetaminophen (TYLENOL) 325 MG tablet Take 325 mg by mouth every 6 (six) hours as needed for headache.    [provider]  Ephedrine-Guaifenesin (PRIMATENE ASTHMA) 12.5-200 MG TABS Take 2 tablets by mouth daily as needed (SOB).    [provider]  ibuprofen (ADVIL,MOTRIN) 800 MG tablet Take 1 tablet (800 mg total) by mouth 3 (three) times daily. 10/04/14   Rolene Course, PA-C  OVER THE COUNTER MEDICATION Take 1 each by mouth daily.    [provider]    Patient's medications were reviewed, including any current OTC or prescription medications documented in Epic.  Patient is not taking any blood thinners, chemotherapy medications, medications that weaken the immune system.    Family History Family History  Problem Relation Age of Onset  . Heart attack Father     Social History Social History   Tobacco Use  .  Smoking status: Former Smoker    Quit date: 03/25/2009    Years since quitting: 10.4  Substance Use Topics  . Alcohol use: Yes    Comment: rarely   . Drug use: No     Allergies   Patient has no known allergies.   Review of Systems Review of Systems  HENT: Positive for facial swelling.   Eyes: Positive for discharge.     Physical Exam Updated Vital Signs There were no vitals taken for this visit.  Physical Exam  General appearance: well developed, NAD, speech is clear and goal oriented Head: Area between the eyebrows appears slightly  swollen and erythematous.  No streaking elsewhere.   Neck: supple  Eye: Conjunctiva normal, no scleral icterus.  No obvious periorbital edema. Lungs: respirations are even and unlabored, no increased work of breathing, no stridor.  Speaking in full sentences MSK: Moving extremities without difficulty Neuro: Alert, following commands, GCS 15.  Psych: normal mood   Initial Impression / Assessment and Plan / ED Course  I have reviewed the triage vital signs and the nursing notes.  Pertinent labs & imaging results that were available during my care of the patient were reviewed by me and considered in my medical decision making (see chart for details).         Patient assessed via emergency department virtual visit for: facial swelling. Previous lab results and imaging was reviewed for the encounter: Yes  Differential diagnosis includes: Insect bite, allergic reaction, viral illness, folliculitis, abscess Assessment/Clinical impression: As he is afebrile and without pain or drainage, doubt abscess.  Whether allergic, viral, folliculitis, likely will improve with symptomatic therapy including anti-inflammatories and warm compresses. Plan: 2 OTC Aleve twice daily with meals, warm compresses 3 times a day.  Follow-up if not improving.  Strict return precautions given, including signs of orbital cellulitis or worsening infection. Disposition: follow up with ENT and/or dermatology if not improving.   This encounter was completed through [facility] utilizing the virtual health platform at the request of, and with the consent of the patient. The encounter took place during the Covid-19 national pandemic when limiting virus exposure to the community at acute care setting is considered an important step in reducing transmission of the disease. The technology provided at the hospital for this encounter are in compliance with HIPPA laws and include a desktop with dual screen monitor, camera  and audio head-set.   Total time used for this encounter:15 minutes    Final Clinical Impressions(s) / ED Diagnoses   Final diagnoses:  Facial swelling    ED Discharge Orders    None       Alveria Apley, PA-C 09/14/19 1039    Long, Arlyss Repress, MD 09/15/19 [phone]

## 2020-06-28 ENCOUNTER — Other Ambulatory Visit: Payer: Self-pay

## 2020-07-15 ENCOUNTER — Ambulatory Visit: Payer: No Typology Code available for payment source | Admitting: Family Medicine

## 2020-07-15 ENCOUNTER — Encounter: Payer: Self-pay | Admitting: Family Medicine

## 2020-07-15 ENCOUNTER — Other Ambulatory Visit: Payer: Self-pay

## 2020-07-15 VITALS — BP 132/90 | HR 76 | Temp 97.8°F | Ht 71.0 in | Wt 249.8 lb

## 2020-07-15 DIAGNOSIS — Z114 Encounter for screening for human immunodeficiency virus [HIV]: Secondary | ICD-10-CM | POA: Diagnosis not present

## 2020-07-15 DIAGNOSIS — Z1159 Encounter for screening for other viral diseases: Secondary | ICD-10-CM

## 2020-07-15 DIAGNOSIS — Z0000 Encounter for general adult medical examination without abnormal findings: Secondary | ICD-10-CM | POA: Diagnosis not present

## 2020-07-15 DIAGNOSIS — Z6834 Body mass index (BMI) 34.0-34.9, adult: Secondary | ICD-10-CM | POA: Diagnosis not present

## 2020-07-15 DIAGNOSIS — E6609 Other obesity due to excess calories: Secondary | ICD-10-CM | POA: Diagnosis not present

## 2020-07-15 DIAGNOSIS — R1032 Left lower quadrant pain: Secondary | ICD-10-CM

## 2020-07-15 DIAGNOSIS — Z23 Encounter for immunization: Secondary | ICD-10-CM

## 2020-07-15 LAB — COMPREHENSIVE METABOLIC PANEL
ALT: 15 U/L (ref 0–53)
AST: 17 U/L (ref 0–37)
Albumin: 4.4 g/dL (ref 3.5–5.2)
Alkaline Phosphatase: 80 U/L (ref 39–117)
BUN: 10 mg/dL (ref 6–23)
CO2: 31 mEq/L (ref 19–32)
Calcium: 9.3 mg/dL (ref 8.4–10.5)
Chloride: 102 mEq/L (ref 96–112)
Creatinine, Ser: 0.94 mg/dL (ref 0.40–1.50)
GFR: 107.37 mL/min (ref >60.00)
Glucose, Bld: 83 mg/dL (ref 70–99)
Potassium: 4.4 mEq/L (ref 3.5–5.1)
Sodium: 140 mEq/L (ref 135–145)
Total Bilirubin: 0.8 mg/dL (ref 0.2–1.2)
Total Protein: 7.5 g/dL (ref 6.0–8.3)

## 2020-07-15 LAB — CBC WITH DIFFERENTIAL/PLATELET
Basophils Absolute: 0 10*3/uL (ref 0.0–0.1)
Basophils Relative: 0.4 % (ref 0.0–3.0)
Eosinophils Absolute: 0.2 10*3/uL (ref 0.0–0.7)
Eosinophils Relative: 1.9 % (ref 0.0–5.0)
HCT: 43.9 % (ref 39.0–52.0)
Hemoglobin: 14.8 g/dL (ref 13.0–17.0)
Lymphocytes Relative: 25.3 % (ref 12.0–46.0)
Lymphs Abs: 2.3 10*3/uL (ref 0.7–4.0)
MCHC: 33.7 g/dL (ref 30.0–36.0)
MCV: 93.5 fl (ref 78.0–100.0)
Monocytes Absolute: 0.7 10*3/uL (ref 0.1–1.0)
Monocytes Relative: 7.9 % (ref 3.0–12.0)
Neutro Abs: 5.9 10*3/uL (ref 1.4–7.7)
Neutrophils Relative %: 64.5 % (ref 43.0–77.0)
Platelets: 286 10*3/uL (ref 150.0–400.0)
RBC: 4.7 Mil/uL (ref 4.22–5.81)
RDW: 14.1 % (ref 11.5–15.5)
WBC: 9.2 10*3/uL (ref 4.0–10.5)

## 2020-07-15 LAB — VITAMIN D 25 HYDROXY (VIT D DEFICIENCY, FRACTURES): VITD: 20.75 ng/mL — ABNORMAL LOW (ref 30.00–100.00)

## 2020-07-15 LAB — TSH: TSH: 1.26 u[IU]/mL (ref 0.35–4.50)

## 2020-07-15 LAB — LIPID PANEL
Cholesterol: 149 mg/dL (ref 0–200)
HDL: 44.7 mg/dL (ref >39.00)
LDL Cholesterol: 92 mg/dL (ref 0–99)
NonHDL: 104.23
Total CHOL/HDL Ratio: 3
Triglycerides: 63 mg/dL (ref 0.0–149.0)
VLDL: 12.6 mg/dL (ref 0.0–40.0)

## 2020-07-15 LAB — HEMOGLOBIN A1C: Hgb A1c MFr Bld: 5.9 % (ref 4.6–6.5)

## 2020-07-15 NOTE — Progress Notes (Signed)
Subjective:    Patient ID: Joshua Galloway, male    DOB: [birth year], 39 y.o.   MRN: [id number]  HPI Chief Complaint  Patient presents with  . New Patient (Initial Visit)  . Hypertension  . Abdominal Pain    lower left x82yr.  Pt states still hurts when lifting daughter of large objects.   Lives with his wife and daughter 50 yo, son 51 yo. 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 Enjoys his job. Likes to spend time with his family, sports, video games, reading  Last CPE-many years ago Tdap- unknown Covid- fully vaccinated Flu-today Dental-overdue, looking for a new dentist Eye-wears glasses, regular eye exam Exercise- not regular,  Diet- fast food 2-3x week, no soda, some juice, drinks water/ sparkling water, eats 2-3 meals a day and snacks.   LLQ pain- Pain last year with lifting, has noticed more soreness, over last year. Does not feel a bulge. Some recent constipation over last week, taking probiotic, drinking apple juice.   Ashtma/Seasonal allergies-occasionally takes children's Benadryl.  Rarely requires albuterol, 1 time per month.  Worse with spending time outside, yardwork, mowing.    Review of Systems  Constitutional: Negative.   HENT: Negative.   Eyes: Negative.   Respiratory: Negative.   Cardiovascular: Negative.   Gastrointestinal: Positive for abdominal pain and constipation. Negative for blood in stool.  Endocrine: Negative.   Genitourinary: Negative.   Musculoskeletal:       Occasional knee pain.   Skin: Negative.   Allergic/Immunologic: Positive for environmental allergies.  Neurological: Negative.   Psychiatric/Behavioral: Negative.        Objective:   Physical Exam Physical Exam  Constitutional: He is oriented to person, place, and time. He appears well-developed and well-nourished.  HENT:  Head: Normocephalic and atraumatic.  Right Ear: External ear normal. TM normal.  Left Ear: External ear normal.  TM normal.  Nose: Nose  normal.  Mouth/Throat: Oropharynx is clear and moist.  Eyes: Conjunctivae are normal.  Neck: Normal range of motion. Neck supple.  Cardiovascular: Normal rate, regular rhythm, normal heart sounds and intact distal pulses.   Pulmonary/Chest: Effort normal and breath sounds normal.  Abdominal: Soft. Bowel sounds are normal. No abdominal hernia visualized or palpated in supine, flexed or standing position. Hernia confirmed negative in the right inguinal area and confirmed negative in the left inguinal area.  Genitourinary: Testes normal and penis normal. Circumcised.  Musculoskeletal: Normal range of motion. He exhibits no edema or tenderness.       Cervical back: Normal.       Thoracic back: Normal.       Lumbar back: Normal.  Lymphadenopathy:    He has no cervical adenopathy.       Right: No inguinal adenopathy present.       Left: No inguinal adenopathy present.  Neurological: He is alert and oriented to person, place, and time.  Skin: Skin is warm and dry.  Psychiatric: He has a normal mood and affect. His behavior is normal. Judgment normal.  Vitals reviewed.     BP 132/90   Pulse 76   Temp 97.8 F (36.6 C) (Temporal)   Ht 5\' 11"  (1.803 m)   Wt 249 lb 12 oz (113.3 kg)   SpO2 97%   BMI 34.83 kg/m  Wt Readings from Last 3 Encounters:  07/15/20 249 lb 12 oz (113.3 kg)  10/04/14 230 lb (104.3 kg)   Depression screen [facility] 2/9 07/15/2020  Decreased Interest 0  Down, Depressed, Hopeless 1  PHQ - 2 Score 1  Altered sleeping 0  Tired, decreased energy 1  Change in appetite 0  Feeling bad or failure about yourself  0  Trouble concentrating 0  Moving slowly or fidgety/restless 0  Suicidal thoughts 0  PHQ-9 Score 2  Difficult doing work/chores Not difficult at all   GAD 7 : Generalized Anxiety Score 07/15/2020  Nervous, Anxious, on Edge 1  Control/stop worrying 0  Worry too much - different things 0  Trouble relaxing 0  Restless 0  Easily annoyed or irritable 1  Afraid -  awful might happen 0  Total GAD 7 Score 2  Anxiety Difficulty Not difficult at all        Assessment & Plan:  1. Annual physical exam - reviewed health maintenance, discussed recommendations, start colon cancer screening at age 55  2. Class 1 obesity due to excess calories without serious comorbidity with body mass index (BMI) of 34.0 to 34.9 in adult - encouraged him to work on diet, decrease fast/ processed foods, increase lean protein, vegetables, exercise  - CBC with Differential - Comprehensive metabolic panel - TSH - Vitamin D, 25-hydroxy - Lipid Panel - Hemoglobin A1c  3. Screening for HIV (human immunodeficiency virus) - Hepatitis C antibody  4. Encounter for hepatitis C screening test for low risk patient - HIV Antibody (routine testing w rflx)  5. Need for influenza vaccination - Flu Vaccine QUAD 6+ mos PF IM (Fluarix Quad PF)  6. Need for tetanus, diphtheria, and acellular pertussis (Tdap) vaccine - Tdap vaccine greater than or equal to 7yo IM  7. LLQ abdominal pain - intermittent in nature, some recent constipation, discussed using otc Miralax.  - no hernia visualized, palpated. Discussed follow up precautions.   This visit occurred during the SARS-CoV-2 public health emergency.  Safety protocols were in place, including screening questions prior to the visit, additional usage of staff PPE, and extensive cleaning of exam room while observing appropriate contact time as indicated for disinfecting solutions.      Olean Ree, FNP-BC  [facility], [facility]  07/15/2020 1:15 PM

## 2020-07-15 NOTE — Patient Instructions (Addendum)
Try Miralax every evening to help get your bowels regular, take for a week at least, can keep taking as needed  Try taking daily long acting antihistamine- cetirizine, loratadine Can rinse nasal passages with saline spray after being outside  Can use head and shoulders on brows, beard. On face, try over the counter hydrocortisone cream once a day for up to 7 days  Keep an eye on your abdomen  There is not one right eating plan for everyone.  It may take trial and error to find what will work for you.  It is important to get adequate protein and fiber with your meals.  It is okay to not eat breakfast or to skip meals if you are not hungry.  Avoid snacking between meals.  Unless you are on a fluid restriction, drink 80 to 90 ounces of water a day.  Suggested resources- www.dietdoctor.com/diabetes/diet www.adaptyourlifeacademy.com-there is a quiz to help you determine how many carbohydrates you should eat a day  www.thefastingmethod.com  Here are some guidelines to help you with meal planning -  Avoid all processed and packaged foods (bread, pasta, crackers, chips, etc) and beverages containing calories.  Avoid added sugars and excessive natural sugars.  Pay attention to how you feel if you consume artificial sweeteners.  Do they make you more hungry or raise your blood sugar?  With every meal and snack, aim to get 20 g of protein (3 ounces of meat, 4 ounces of fish, 3 eggs, protein powder, 1 cup [place] yogurt, 1 cup cottage cheese, etc.)  Increase fiber in the form of non-starchy vegetables.  These help you feel full with very little carbohydrates and are good for gut health.  Nonstarchy vegetables include summer squash, onions, peppers, tomatoes, eggplant, broccoli, cauliflower, cabbage, lettuce, spinach.  Have small amounts of good fats such as avocado, nuts, olive oil, nut butters, olives.  Add a little cheese to your meals to make them tasty.   Try to plan your meals for the week and do  some meal preparation when able.  If possible, make lunches for the week ahead of time.  Plan a couple of dinners and make enough so you can have leftovers.  Build in a treat once a week.

## 2020-07-16 LAB — HEPATITIS C ANTIBODY
Hepatitis C Ab: NONREACTIVE
SIGNAL TO CUT-OFF: 0.02 (ref <1.00)

## 2020-07-16 LAB — HIV ANTIBODY (ROUTINE TESTING W REFLEX): HIV 1&2 Ab, 4th Generation: NONREACTIVE

## 2022-02-26 ENCOUNTER — Ambulatory Visit: Payer: No Typology Code available for payment source | Admitting: Nurse Practitioner

## 2022-03-08 NOTE — Progress Notes (Signed)
? ?Established Patient Office Visit ? ?Subjective   ?Patient ID: Joshua Galloway, male    DOB: 20-Jan-1981  Age: 41 y.o. MRN: [id number] ? ?Chief Complaint  ?Patient presents with  ? Establish Care  ?  Np. est care. Pt c/o discomfort in lower Lf side of abd and some discomfort in stomach when drinking alcoholic beverages. Requesting CPE. Pt is fasting  ? ? ?HPI ? ?Joshua Galloway is here today to establish care with a new provider.  Introduced to Publishing rights managernurse practitioner role and practice setting.  All questions answered.  Discussed provider/patient relationship and expectations. ? ?He has been having intermittent LLQ abdominal pain for the past several years. He states that lifting something heavy can make this pain worse. He also noted that drinking alcohol can make this pain worse. He has an appointment with GI at the end of June.  He denies nausea, vomiting, constipation, diarrhea. ? ?He has a history of asthma as a child, however it has not been an issue as an adult.  He is not taking any medications for this. ? ?He has a history of periods of hypertension. He is not checking his blood pressure at home. He has not taken any medication for this in the past. Denies chest pain, shortness of breath, and headaches today. He states that if his sleep is off for a few days, he will get a headache. He has been to a cardiologist because his dad passed away from heart disease younger than 42 years old.  ? ?Depression and anxiety screen done: ? ? ?  03/09/2022  ?  9:55 AM 07/15/2020  ? 11:38 AM  ?Depression screen PHQ 2/9  ?Decreased Interest 0 0  ?Down, Depressed, Hopeless 0 1  ?PHQ - 2 Score 0 1  ?Altered sleeping  0  ?Tired, decreased energy  1  ?Change in appetite  0  ?Feeling bad or failure about yourself   0  ?Trouble concentrating  0  ?Moving slowly or fidgety/restless  0  ?Suicidal thoughts  0  ?PHQ-9 Score  2  ?Difficult doing work/chores  Not difficult at all  ? ? ?  07/15/2020  ? 11:39 AM  ?GAD 7 : Generalized Anxiety Score  ?Nervous,  Anxious, on Edge 1  ?Control/stop worrying 0  ?Worry too much - different things 0  ?Trouble relaxing 0  ?Restless 0  ?Easily annoyed or irritable 1  ?Afraid - awful might happen 0  ?Total GAD 7 Score 2  ?Anxiety Difficulty Not difficult at all  ? ? ?Past Medical History:  ?Diagnosis Date  ? Allergy   ? Asthma   ? Chest pain   ? Chicken pox   ? Dyspnea   ? Heart murmur   ? Hypertension   ? Irregular heart rate   ? Palpitations   ? Prediabetes   ? Seasonal allergies   ? ?Past Surgical History:  ?Procedure Laterality Date  ? DENTAL SURGERY    ? ?Social History  ? ?Socioeconomic History  ? Marital status: Married  ?  Spouse name: Not on file  ? Number of children: Not on file  ? Years of education: Not on file  ? Highest education level: Not on file  ?Occupational History  ? Not on file  ?Tobacco Use  ? Smoking status: Former  ?  Types: Cigarettes  ?  Quit date: 03/25/2009  ?  Years since quitting: 12.9  ? Smokeless tobacco: Never  ?Vaping Use  ? Vaping Use: Never used  ?Substance and Sexual  Activity  ? Alcohol use: Yes  ?  Comment: rarely   ? Drug use: No  ? Sexual activity: Not on file  ?Other Topics Concern  ? Not on file  ?Social History Narrative  ? Not on file  ? ?Social Determinants of Health  ? ?Financial Resource Strain: Not on file  ?Food Insecurity: Not on file  ?Transportation Needs: Not on file  ?Physical Activity: Not on file  ?Stress: Not on file  ?Social Connections: Not on file  ?Intimate Partner Violence: Not on file  ? ?Allergies  ?Allergen Reactions  ? Mixed Grasses   ? ? ?Review of Systems  ?Constitutional:  Positive for malaise/fatigue.  ?HENT: Negative.    ?Eyes: Negative.   ?Respiratory: Negative.    ?Cardiovascular:  Positive for palpitations (intermittent). Negative for chest pain.  ?Gastrointestinal:  Positive for abdominal pain (intermittent LLQ). Negative for constipation, diarrhea, nausea and vomiting.  ?Genitourinary: Negative.   ?Musculoskeletal: Negative.   ?Skin: Negative.    ?Neurological: Negative.   ?Psychiatric/Behavioral:  Negative for depression. The patient is nervous/anxious (usually from stress at work).   ? ?  ?Objective:  ?  ? ?BP (!) 160/100 (BP Location: Right Arm, Cuff Size: Large)   Pulse 69   Temp (!) 97.3 ?F (36.3 ?C) (Temporal)   Ht 5\' 11"  (1.803 m)   Wt 246 lb (111.6 kg)   SpO2 100%   BMI 34.31 kg/m?  ?BP Readings from Last 3 Encounters:  ?03/09/22 (!) 160/100  ?07/15/20 132/90  ?10/04/14 125/72  ? ?  ? ?Physical Exam ?Vitals and nursing note reviewed.  ?Constitutional:   ?   General: He is not in acute distress. ?   Appearance: Normal appearance.  ?HENT:  ?   Head: Normocephalic and atraumatic.  ?   Right Ear: Tympanic membrane, ear canal and external ear normal.  ?   Left Ear: Tympanic membrane, ear canal and external ear normal.  ?   Nose: Nose normal.  ?   Mouth/Throat:  ?   Mouth: Mucous membranes are moist.  ?   Pharynx: Oropharynx is clear.  ?Eyes:  ?   Conjunctiva/sclera: Conjunctivae normal.  ?Cardiovascular:  ?   Rate and Rhythm: Normal rate and regular rhythm.  ?   Pulses: Normal pulses.  ?   Heart sounds: Normal heart sounds.  ?Pulmonary:  ?   Effort: Pulmonary effort is normal.  ?   Breath sounds: Normal breath sounds.  ?Abdominal:  ?   Palpations: Abdomen is soft. There is no mass.  ?   Tenderness: There is abdominal tenderness (slight LLQ). There is no guarding or rebound.  ?   Hernia: No hernia is present.  ?Musculoskeletal:     ?   General: Normal range of motion.  ?   Cervical back: Normal range of motion and neck supple. No tenderness.  ?   Right lower leg: No edema.  ?   Left lower leg: No edema.  ?Lymphadenopathy:  ?   Cervical: No cervical adenopathy.  ?Skin: ?   General: Skin is warm and dry.  ?Neurological:  ?   General: No focal deficit present.  ?   Mental Status: He is alert and oriented to person, place, and time.  ?   Cranial Nerves: No cranial nerve deficit.  ?   Coordination: Coordination normal.  ?   Gait: Gait normal.   ?Psychiatric:     ?   Mood and Affect: Mood normal.     ?  Behavior: Behavior normal.     ?   Thought Content: Thought content normal.     ?   Judgment: Judgment normal.  ? ? ? ?No results found for any visits on 03/09/22. ? ?Last CBC ?Lab Results  ?Component Value Date  ? WBC 9.2 07/15/2020  ? HGB 14.8 07/15/2020  ? HCT 43.9 07/15/2020  ? MCV 93.5 07/15/2020  ? MCH 30.7 10/04/2014  ? RDW 14.1 07/15/2020  ? PLT 286.0 07/15/2020  ? ?Last metabolic panel ?Lab Results  ?Component Value Date  ? GLUCOSE 83 07/15/2020  ? NA 140 07/15/2020  ? K 4.4 07/15/2020  ? CL 102 07/15/2020  ? CO2 31 07/15/2020  ? BUN 10 07/15/2020  ? CREATININE 0.94 07/15/2020  ? GFRNONAA >90 10/04/2014  ? CALCIUM 9.3 07/15/2020  ? PROT 7.5 07/15/2020  ? ALBUMIN 4.4 07/15/2020  ? BILITOT 0.8 07/15/2020  ? ALKPHOS 80 07/15/2020  ? AST 17 07/15/2020  ? ALT 15 07/15/2020  ? ANIONGAP 12 10/04/2014  ? ?Last lipids ?Lab Results  ?Component Value Date  ? CHOL 149 07/15/2020  ? HDL 44.70 07/15/2020  ? LDLCALC 92 07/15/2020  ? TRIG 63.0 07/15/2020  ? CHOLHDL 3 07/15/2020  ? ?  ? ?The 10-year ASCVD risk score (Arnett DK, et al., 2019) is: 4.6% ? ?  ?Assessment & Plan:  ? ?Problem List Items Addressed This Visit   ? ?  ? Cardiovascular and Mediastinum  ? Primary hypertension  ?  Chronic, not controlled.  Blood pressure today is 160/100.  He states that it is sometimes elevated when he is stressed, and he had a work meeting this morning that made him stressed.  Encouraged him to limit the amount of salt in his diet and increase his exercise.  Also encouraged him to check his blood pressure at home and write it down.  Follow-up in the next 1 to 2 months or sooner with concerns.  Check CMP, CBC, lipid panel today. ? ?  ?  ? Relevant Orders  ? Lipid panel  ?  ? Other  ? Vitamin D deficiency  ?  Striae vitamin D deficiency, will check vitamin D levels today and treat based on results. ? ?  ?  ? Relevant Orders  ? VITAMIN D 25 Hydroxy (Vit-D Deficiency, Fractures)   ? Prediabetes  ?  On 07/15/2020 his A1c was 5.9%.  We will check A1c today and treat based on results. ? ?  ?  ? Relevant Orders  ? Lipid panel  ? Hemoglobin A1c  ? LLQ abdominal pain  ?  He has been having intermittent chronic

## 2022-03-09 ENCOUNTER — Encounter: Payer: Self-pay | Admitting: Nurse Practitioner

## 2022-03-09 ENCOUNTER — Ambulatory Visit (INDEPENDENT_AMBULATORY_CARE_PROVIDER_SITE_OTHER): Payer: No Typology Code available for payment source | Admitting: Nurse Practitioner

## 2022-03-09 VITALS — BP 160/100 | HR 69 | Temp 97.3°F | Ht 71.0 in | Wt 246.0 lb

## 2022-03-09 DIAGNOSIS — R7303 Prediabetes: Secondary | ICD-10-CM | POA: Diagnosis not present

## 2022-03-09 DIAGNOSIS — I10 Essential (primary) hypertension: Secondary | ICD-10-CM

## 2022-03-09 DIAGNOSIS — Z0000 Encounter for general adult medical examination without abnormal findings: Secondary | ICD-10-CM

## 2022-03-09 DIAGNOSIS — R1032 Left lower quadrant pain: Secondary | ICD-10-CM | POA: Insufficient documentation

## 2022-03-09 DIAGNOSIS — E559 Vitamin D deficiency, unspecified: Secondary | ICD-10-CM | POA: Insufficient documentation

## 2022-03-09 DIAGNOSIS — R030 Elevated blood-pressure reading, without diagnosis of hypertension: Secondary | ICD-10-CM | POA: Insufficient documentation

## 2022-03-09 DIAGNOSIS — E669 Obesity, unspecified: Secondary | ICD-10-CM | POA: Insufficient documentation

## 2022-03-09 LAB — CBC WITH DIFFERENTIAL/PLATELET
Basophils Absolute: 0 10*3/uL (ref 0.0–0.1)
Basophils Relative: 0.4 % (ref 0.0–3.0)
Eosinophils Absolute: 0.1 10*3/uL (ref 0.0–0.7)
Eosinophils Relative: 1 % (ref 0.0–5.0)
HCT: 41.6 % (ref 39.0–52.0)
Hemoglobin: 14 g/dL (ref 13.0–17.0)
Lymphocytes Relative: 20.8 % (ref 12.0–46.0)
Lymphs Abs: 1.9 10*3/uL (ref 0.7–4.0)
MCHC: 33.6 g/dL (ref 30.0–36.0)
MCV: 92.6 fl (ref 78.0–100.0)
Monocytes Absolute: 0.6 10*3/uL (ref 0.1–1.0)
Monocytes Relative: 7.2 % (ref 3.0–12.0)
Neutro Abs: 6.3 10*3/uL (ref 1.4–7.7)
Neutrophils Relative %: 70.6 % (ref 43.0–77.0)
Platelets: 280 10*3/uL (ref 150.0–400.0)
RBC: 4.49 Mil/uL (ref 4.22–5.81)
RDW: 14.4 % (ref 11.5–15.5)
WBC: 9 10*3/uL (ref 4.0–10.5)

## 2022-03-09 LAB — HEMOGLOBIN A1C: Hgb A1c MFr Bld: 5.6 % (ref 4.6–6.5)

## 2022-03-09 LAB — LIPID PANEL
Cholesterol: 139 mg/dL (ref 0–200)
HDL: 41.6 mg/dL (ref >39.00)
LDL Cholesterol: 87 mg/dL (ref 0–99)
NonHDL: 97.53
Total CHOL/HDL Ratio: 3
Triglycerides: 52 mg/dL (ref 0.0–149.0)
VLDL: 10.4 mg/dL (ref 0.0–40.0)

## 2022-03-09 LAB — COMPREHENSIVE METABOLIC PANEL
ALT: 9 U/L (ref 0–53)
AST: 12 U/L (ref 0–37)
Albumin: 4.2 g/dL (ref 3.5–5.2)
Alkaline Phosphatase: 68 U/L (ref 39–117)
BUN: 11 mg/dL (ref 6–23)
CO2: 26 mEq/L (ref 19–32)
Calcium: 9.1 mg/dL (ref 8.4–10.5)
Chloride: 104 mEq/L (ref 96–112)
Creatinine, Ser: 0.93 mg/dL (ref 0.40–1.50)
GFR: 101.66 mL/min (ref >60.00)
Glucose, Bld: 91 mg/dL (ref 70–99)
Potassium: 4 mEq/L (ref 3.5–5.1)
Sodium: 139 mEq/L (ref 135–145)
Total Bilirubin: 0.7 mg/dL (ref 0.2–1.2)
Total Protein: 7.7 g/dL (ref 6.0–8.3)

## 2022-03-09 LAB — VITAMIN D 25 HYDROXY (VIT D DEFICIENCY, FRACTURES): VITD: 18.92 ng/mL — ABNORMAL LOW (ref 30.00–100.00)

## 2022-03-09 MED ORDER — VITAMIN D (ERGOCALCIFEROL) 1.25 MG (50000 UNIT) PO CAPS: 50000.0000 [IU] | ORAL_CAPSULE | ORAL | 0 refills | Status: DC

## 2022-03-09 NOTE — Assessment & Plan Note (Signed)
BMI 34.3.  Discussed healthy eating and exercise.  Goal is to lose 1 to 2 pounds per week. ?

## 2022-03-09 NOTE — Assessment & Plan Note (Signed)
He has been having intermittent chronic left lower abdominal pain.  He states that the pain is worse when lifting heavy things and he noticed that it also worsened last Friday when he drank some alcohol.  We will check CMP, CBC today.  No red flags on exam.  He has an appointment with GI at the end of June, encouraged him to keep this appointment. ?

## 2022-03-09 NOTE — Progress Notes (Signed)
? ?  Established Patient Office Visit ? ?Subjective   ?Patient ID: Joshua Galloway, male    DOB: 04/19/1981  Age: 41 y.o. MRN: [id number] ? ?Chief Complaint  ?Patient presents with  ? Establish Care  ?  Np. est care. Pt c/o discomfort in lower Lf side of abd and some discomfort in stomach when drinking alcoholic beverages. Requesting CPE. Pt is fasting  ? ? ?HPI ? ? ? ?ROS ? ?  ?Objective:  ?  ? ?BP (!) 160/100 (BP Location: Right Arm, Cuff Size: Large)   Pulse 69   Temp (!) 97.3 ?F (36.3 ?C) (Temporal)   Ht 5\' 11"  (1.803 m)   Wt 246 lb (111.6 kg)   SpO2 100%   BMI 34.31 kg/m?  ? ? ?Physical Exam ? ? ?No results found for any visits on 03/09/22. ? ? ? ?The 10-year ASCVD risk score (Arnett DK, et al., 2019) is: 4.6% ? ?  ?Assessment & Plan:  ? ?Problem List Items Addressed This Visit   ? ?  ? Cardiovascular and Mediastinum  ? Primary hypertension  ? Relevant Orders  ? Lipid panel  ?  ? Other  ? Vitamin D deficiency  ? Relevant Orders  ? VITAMIN D 25 Hydroxy (Vit-D Deficiency, Fractures)  ? Prediabetes  ? Relevant Orders  ? Lipid panel  ? Hemoglobin A1c  ? ?Other Visit Diagnoses   ? ? Routine general medical examination at a health care facility    -  Primary  ? Relevant Orders  ? CBC with Differential/Platelet  ? Comprehensive metabolic panel  ? ?  ? ? ?Return in about 2 months (around 05/09/2022) for 1-2 months blood pressure.  ? ? ?07/10/2022, CMA ? ?

## 2022-03-09 NOTE — Addendum Note (Signed)
Addended by: Rodman Pickle A on: 03/09/2022 03:48 PM ? ? Modules accepted: Orders ? ?

## 2022-03-09 NOTE — Patient Instructions (Addendum)
It was great to see you! ? ?We are checking your labs today and will let you know the results via mychart/phone.  ? ?Start checking your blood pressure at home a few times a week and write it down. Limit the amount of salt in your diet and increase exercise.  ? ?I will be glad to fill out your form for work.  ? ?Keep your appointment with GI for your abdominal pain.  ? ?Let's follow-up in 1-2 months, sooner if you have concerns. ? ?If a referral was placed today, you will be contacted for an appointment. Please note that routine referrals can sometimes take up to 3-4 weeks to process. Please call our office if you haven't heard anything after this time frame. ? ?Take care, ? ?Rodman Pickle, NP ? ?

## 2022-03-09 NOTE — Assessment & Plan Note (Signed)
Chronic, not controlled.  Blood pressure today is 160/100.  He states that it is sometimes elevated when he is stressed, and he had a work meeting this morning that made him stressed.  Encouraged him to limit the amount of salt in his diet and increase his exercise.  Also encouraged him to check his blood pressure at home and write it down.  Follow-up in the next 1 to 2 months or sooner with concerns.  Check CMP, CBC, lipid panel today. ?

## 2022-03-09 NOTE — Assessment & Plan Note (Signed)
Striae vitamin D deficiency, will check vitamin D levels today and treat based on results. ?

## 2022-03-09 NOTE — Assessment & Plan Note (Signed)
On 07/15/2020 his A1c was 5.9%.  We will check A1c today and treat based on results. ?

## 2022-05-11 ENCOUNTER — Ambulatory Visit: Payer: No Typology Code available for payment source | Admitting: Nurse Practitioner

## 2022-06-01 ENCOUNTER — Ambulatory Visit: Payer: No Typology Code available for payment source | Admitting: Gastroenterology

## 2022-06-09 ENCOUNTER — Ambulatory Visit: Payer: No Typology Code available for payment source | Admitting: Nurse Practitioner

## 2022-06-15 ENCOUNTER — Other Ambulatory Visit: Payer: Self-pay

## 2022-06-15 ENCOUNTER — Emergency Department (HOSPITAL_COMMUNITY): Payer: No Typology Code available for payment source

## 2022-06-15 ENCOUNTER — Encounter (HOSPITAL_COMMUNITY): Payer: Self-pay | Admitting: *Deleted

## 2022-06-15 ENCOUNTER — Emergency Department (HOSPITAL_COMMUNITY)
Admission: EM | Admit: 2022-06-15 | Discharge: 2022-06-16 | Disposition: A | Discharge status: Home / Self Care | Payer: No Typology Code available for payment source | Attending: Emergency Medicine | Admitting: Emergency Medicine

## 2022-06-15 DIAGNOSIS — Z5321 Procedure and treatment not carried out due to patient leaving prior to being seen by health care provider: Secondary | ICD-10-CM | POA: Diagnosis not present

## 2022-06-15 DIAGNOSIS — R5383 Other fatigue: Secondary | ICD-10-CM | POA: Diagnosis present

## 2022-06-15 DIAGNOSIS — R0789 Other chest pain: Secondary | ICD-10-CM | POA: Diagnosis not present

## 2022-06-15 DIAGNOSIS — I10 Essential (primary) hypertension: Secondary | ICD-10-CM | POA: Diagnosis not present

## 2022-06-15 LAB — CBC
HCT: 42.1 % (ref 39.0–52.0)
Hemoglobin: 14.1 g/dL (ref 13.0–17.0)
MCH: 30.9 pg (ref 26.0–34.0)
MCHC: 33.5 g/dL (ref 30.0–36.0)
MCV: 92.1 fL (ref 80.0–100.0)
Platelets: 301 10*3/uL (ref 150–400)
RBC: 4.57 MIL/uL (ref 4.22–5.81)
RDW: 13.8 % (ref 11.5–15.5)
WBC: 13.9 10*3/uL — ABNORMAL HIGH (ref 4.0–10.5)
nRBC: 0 % (ref 0.0–0.2)

## 2022-06-15 MED ORDER — ASPIRIN 81 MG PO CHEW
324.0000 mg | CHEWABLE_TABLET | Freq: Once | ORAL | Status: AC
  Administered 2022-06-15: 324 mg via ORAL
  Filled 2022-06-15: qty 4

## 2022-06-15 NOTE — ED Triage Notes (Addendum)
Pt states he was lying down at home and he did not feel well, he took his bp and it was 176/112. No prescribed BP medications. Says he could not get comfortable and was having discomfort on the left side of his chest

## 2022-06-15 NOTE — ED Provider Triage Note (Signed)
  Emergency Medicine Provider Triage Evaluation Note  MRN:  [id number]  Arrival date & time: 06/15/22    Medically screening exam initiated at 11:29 PM.   CC:   High Blood Pressure  HPI:  Joshua Galloway is a 41 y.o. year-old male presents to the ED with chief complaint of chest tightness. Onset was tonight at 9pm.  States he had a few episodes earlier today, thought it was due to stress at work.  Took his BP and it was very high.  Denies having the symptoms now.  Denies nausea or diaphoresis.  States had mild SOB.  History provided by patient. ROS:  -As included in HPI PE:   Vitals:   06/15/22 2313  BP: (!) 167/123  Pulse: 77  Resp: 18  Temp: 97.9 F (36.6 C)  SpO2: 99%    Non-toxic appearing No respiratory distress  MDM:  Based on signs and symptoms, hypertensive urgency/emergency is highest on my differential, followed by ACS. I've ordered labs and imaging in triage to expedite lab/diagnostic workup.  Patient was informed that the remainder of the evaluation will be completed by another provider, this initial triage assessment does not replace that evaluation, and the importance of remaining in the ED until their evaluation is complete.    Roxy Horseman, PA-C 06/15/22 2333

## 2022-06-16 ENCOUNTER — Ambulatory Visit: Payer: Self-pay

## 2022-06-16 ENCOUNTER — Emergency Department
Admission: EM | Admit: 2022-06-16 | Discharge: 2022-06-16 | Disposition: A | Discharge status: Home / Self Care | Payer: No Typology Code available for payment source | Attending: Emergency Medicine | Admitting: Emergency Medicine

## 2022-06-16 ENCOUNTER — Ambulatory Visit: Payer: No Typology Code available for payment source

## 2022-06-16 ENCOUNTER — Telehealth: Payer: Self-pay | Admitting: Emergency Medicine

## 2022-06-16 ENCOUNTER — Emergency Department: Payer: No Typology Code available for payment source

## 2022-06-16 ENCOUNTER — Encounter: Payer: Self-pay | Admitting: Emergency Medicine

## 2022-06-16 DIAGNOSIS — R519 Headache, unspecified: Secondary | ICD-10-CM | POA: Diagnosis not present

## 2022-06-16 DIAGNOSIS — I10 Essential (primary) hypertension: Secondary | ICD-10-CM | POA: Diagnosis present

## 2022-06-16 DIAGNOSIS — Z20822 Contact with and (suspected) exposure to covid-19: Secondary | ICD-10-CM | POA: Insufficient documentation

## 2022-06-16 LAB — COMPREHENSIVE METABOLIC PANEL
ALT: 14 U/L (ref 0–44)
AST: 17 U/L (ref 15–41)
Albumin: 4.1 g/dL (ref 3.5–5.0)
Alkaline Phosphatase: 79 U/L (ref 38–126)
Anion gap: 6 (ref 5–15)
BUN: 14 mg/dL (ref 6–20)
CO2: 29 mmol/L (ref 22–32)
Calcium: 9.2 mg/dL (ref 8.9–10.3)
Chloride: 104 mmol/L (ref 98–111)
Creatinine, Ser: 0.85 mg/dL (ref 0.61–1.24)
GFR, Estimated: >60 mL/min (ref >60)
Glucose, Bld: 106 mg/dL — ABNORMAL HIGH (ref 70–99)
Potassium: 3.7 mmol/L (ref 3.5–5.1)
Sodium: 139 mmol/L (ref 135–145)
Total Bilirubin: 1.1 mg/dL (ref 0.3–1.2)
Total Protein: 8.1 g/dL (ref 6.5–8.1)

## 2022-06-16 LAB — TROPONIN I (HIGH SENSITIVITY)
Troponin I (High Sensitivity): 3 ng/L (ref <18)
Troponin I (High Sensitivity): 4 ng/L (ref <18)
Troponin I (High Sensitivity): 5 ng/L (ref <18)

## 2022-06-16 LAB — CBC
HCT: 42.8 % (ref 39.0–52.0)
Hemoglobin: 14.2 g/dL (ref 13.0–17.0)
MCH: 30.5 pg (ref 26.0–34.0)
MCHC: 33.2 g/dL (ref 30.0–36.0)
MCV: 91.8 fL (ref 80.0–100.0)
Platelets: 293 10*3/uL (ref 150–400)
RBC: 4.66 MIL/uL (ref 4.22–5.81)
RDW: 13.7 % (ref 11.5–15.5)
WBC: 11.6 10*3/uL — ABNORMAL HIGH (ref 4.0–10.5)
nRBC: 0 % (ref 0.0–0.2)

## 2022-06-16 LAB — RESP PANEL BY RT-PCR (FLU A&B, COVID) ARPGX2
Influenza A by PCR: NEGATIVE
Influenza B by PCR: NEGATIVE
SARS Coronavirus 2 by RT PCR: NEGATIVE

## 2022-06-16 LAB — BASIC METABOLIC PANEL
Anion gap: 7 (ref 5–15)
BUN: 13 mg/dL (ref 6–20)
CO2: 26 mmol/L (ref 22–32)
Calcium: 8.9 mg/dL (ref 8.9–10.3)
Chloride: 107 mmol/L (ref 98–111)
Creatinine, Ser: 0.93 mg/dL (ref 0.61–1.24)
GFR, Estimated: >60 mL/min (ref >60)
Glucose, Bld: 95 mg/dL (ref 70–99)
Potassium: 3.7 mmol/L (ref 3.5–5.1)
Sodium: 140 mmol/L (ref 135–145)

## 2022-06-16 NOTE — ED Provider Triage Note (Signed)
Emergency Medicine Provider Triage Evaluation Note  Joshua Galloway , a 41 y.o. male  was evaluated in triage.  Pt complains of chest discomfort last night, elevated blood pressure.  Last night was LW BS at [place].  2 troponins were drawn and negative.  WBCs were elevated patient states he had chills last night and some runny nose/congestion..  Review of Systems  Positive: See above Negative: Pain shortness of breath  Physical Exam  BP (!) 172/126 (BP Location: Left Arm)   Pulse 75   Temp 98 F (36.7 C)   Resp 18   Ht 5\' 11"  (1.803 m)   Wt 111.1 kg   SpO2 100%   BMI 34.17 kg/m  Gen:   Awake, no distress   Resp:  Normal effort  MSK:   Moves extremities without difficulty  Other:    Medical Decision Making  Medically screening exam initiated at 12:47 PM.  Appropriate orders placed.  Joshua Galloway was informed that the remainder of the evaluation will be completed by another provider, this initial triage assessment does not replace that evaluation, and the importance of remaining in the ED until their evaluation is complete.     , PA-C 06/16/22 1248

## 2022-06-16 NOTE — ED Provider Notes (Signed)
[facility] Provider Note    Event Date/Time   First MD Initiated Contact with Patient 06/16/22 1507     (approximate)   History   Hypertension   HPI  Joshua Galloway is a 41 y.o. male with a past medical history of primary hypertension, prediabetes, obesity who presents today for evaluation of hypertension.  Patient reports that he is been taking his blood pressure daily and noticed that it was high.  He has a follow-up appointment tomorrow but decided to come to the emergency department instead.  He reports that he does not have any chest pain, shortness of breath, headache, nausea, vomiting currently.  He reports that last night he had some chest discomfort so he went to [facility] but did not feel like waiting.  Patient went to [facility] yesterday where he had a medical screening exam only.  At that time he had chest tightness and hypertension.  He left prior to evaluation.  Per chart review, patient had a primary care doctor appointment on 03/09/2022.  He was found to be hypertensive.  He was encouraged to limit the amount of salt in his diet and increase his exercise.  Also encouraged to write his blood pressure down at home, and follow-up within 2 months.  Patient Active Problem List   Diagnosis Date Noted   Vitamin D deficiency 03/09/2022   Prediabetes 03/09/2022   Primary hypertension 03/09/2022   LLQ abdominal pain 03/09/2022   Obesity (BMI 30-39.9) 03/09/2022          Physical Exam   Triage Vital Signs: ED Triage Vitals  Enc Vitals Group     BP 06/16/22 1240 (!) 172/126     Pulse Rate 06/16/22 1240 75     Resp 06/16/22 1240 18     Temp 06/16/22 1240 98 F (36.7 C)     Temp src --      SpO2 06/16/22 1240 100 %     Weight 06/16/22 1241 245 lb (111.1 kg)     Height 06/16/22 1241 5\' 11"  (1.803 m)     Head Circumference --      Peak Flow --      Pain Score 06/16/22 1241 0     Pain Loc --      Pain Edu? --      Excl. in GC? --      Most recent vital signs: Vitals:   06/16/22 1652 06/16/22 1653  BP: (!) 120/110   Pulse: 73   Resp: 18   Temp:  98 F (36.7 C)  SpO2: 99%     Physical Exam Vitals and nursing note reviewed.  Constitutional:      General: Awake and alert. No acute distress.    Appearance: Normal appearance. The patient is overweight.  HENT:     Head: Normocephalic and atraumatic.     Mouth: Mucous membranes are moist.  Eyes:     General: PERRL. Normal EOMs        Right eye: No discharge.        Left eye: No discharge.     Conjunctiva/sclera: Conjunctivae normal.  Cardiovascular:     Rate and Rhythm: Normal rate and regular rhythm.     Pulses: Normal pulses.     Heart sounds: Normal heart sounds Pulmonary:     Effort: Pulmonary effort is normal. No respiratory distress.     Breath sounds: Normal breath sounds.  Abdominal:     Abdomen is soft. There is  no abdominal tenderness. No rebound or guarding. No distention. Musculoskeletal:        General: No swelling. Normal range of motion.     Cervical back: Normal range of motion and neck supple.  Skin:    General: Skin is warm and dry.     Capillary Refill: Capillary refill takes less than 2 seconds.     Findings: No rash.  Neurological:     Mental Status: The patient is awake and alert.  Neurological: GCS 15 alert and oriented x3 Normal speech, no expressive or receptive aphasia or dysarthria Cranial nerves II through XII intact Normal visual fields 5 out of 5 strength in all 4 extremities with intact sensation throughout No extremity drift Normal finger-to-nose testing, no limb or truncal ataxia     ED Results / Procedures / Treatments   Labs (all labs ordered are listed, but only abnormal results are displayed) Labs Reviewed  CBC - Abnormal; Notable for the following components:      Result Value   WBC 11.6 (*)    All other components within normal limits  COMPREHENSIVE METABOLIC PANEL - Abnormal; Notable for the  following components:   Glucose, Bld 106 (*)    All other components within normal limits  RESP PANEL BY RT-PCR (FLU A&B, COVID) ARPGX2  TROPONIN I (HIGH SENSITIVITY)     EKG     RADIOLOGY I independently reviewed and interpreted imaging and agree with radiologists findings.     PROCEDURES:  Critical Care performed:   Procedures   MEDICATIONS ORDERED IN ED: Medications - No data to display   IMPRESSION / MDM / ASSESSMENT AND PLAN / ED COURSE  I reviewed the triage vital signs and the nursing notes.   Differential diagnosis includes, but is not limited to, primary hypertension, elevated blood pressure, hypertensive emergency, hypertensive urgency.  Patient is awake and alert, hypertensive on arrival, but currently has no complaints.  He reports that yesterday he had chest tightness but this has resolved today.   EKG demonstrates no acute evidence of ischemia, similar to EKG obtained yesterday.  No evidence of endorgan damage on blood work.  Troponin is decreased from yesterday, though both within normal limits.  CT head without acute findings.  Exam and findings not consistent with hypertensive urgency or emergency.  Blood pressures are equal in bilateral arms, not consistent with dissection, currently no chest pain or back pain.  Patient is currently asymptomatic.  I recommended that he follow-up with his primary care doctor tomorrow as he already has scheduled.  Advised that they discuss hypertensive management at that time.  Discussed with him that we do not like to start this in the emergency department as we do not have the ability to follow-up with him.  He understands and agrees.   Patient's presentation is most consistent with acute complicated illness / injury requiring diagnostic workup.     FINAL CLINICAL IMPRESSION(S) / ED DIAGNOSES   Final diagnoses:  Hypertension, unspecified type     Rx / DC Orders   ED Discharge Orders     None        Note:   This document was prepared using Dragon voice recognition software and may include unintentional dictation errors.   Keturah Shavers 06/16/22 1654    Concha Se, MD 06/17/22 [id number]

## 2022-06-16 NOTE — Telephone Encounter (Signed)
Late entry 8:27.    Joaquin Courts, NP reviewed patient record and complaint.  Encouraged patient to return to ED and keep appointment that is scheduled with provider.   This nurse called phone number in medical record.  Verified identity with 2 identifiers.  Patient encouraged to go return to ED and keep appt with primary provider as scheduled.  Patient asked about blood pressure medicine script, encouraged to follow above instructions

## 2022-06-16 NOTE — ED Triage Notes (Signed)
Patient to ED via POV for hypertension. Patient states he does not currently take medication for high blood pressure but has an appointment tomorrow with PCP for same.

## 2022-06-16 NOTE — ED Notes (Signed)
Patient left without being seen, states he will go to urgent care

## 2022-06-16 NOTE — Discharge Instructions (Signed)
Your blood tests, CT scan were normal today.  Please follow-up tomorrow with your primary care provider as you have scheduled already for further blood pressure management.  Please return for any new, worsening, or change in symptoms or other concerns.

## 2022-06-16 NOTE — Telephone Encounter (Signed)
  Chief Complaint: HTN Symptoms: 182/118, 163/113 Frequency: ongoing Pertinent Negatives: Patient denies Chest pain, SOB Disposition: [] ED /[] Urgent Care (no appt availability in office) / [] Appointment(In office/virtual)/ []  [place] [facility]/ [] Home Care/ [] Refused Recommended Disposition /[] [place] [facility]/ []  Follow-up with PCP Additional Notes: Pt has elevated BP's. Pt was at the Ed but left before being seen yesterday. Pt reports some right sided leg numbness. BP's remain elevated. PT will go to ED for care. Reason for Disposition  Systolic BP  >= 180 OR Diastolic >= 110  Systolic BP  >= 160 OR Diastolic >= 100 AND [2] cardiac (e.g., breathing difficulty, chest pain) or neurologic symptoms (e.g., new-onset blurred or double vision, unsteady gait)  Answer Assessment - Initial Assessment Questions 1. BLOOD PRESSURE: "What is the blood pressure?" "Did you take at least two measurements 5 minutes apart?"     182/118 just now. 2. ONSET: "When did you take your blood pressure?"     Just now.  3. HOW: "How did you take your blood pressure?" (e.g., automatic home BP monitor, visiting nurse)     Automatic - home 4. HISTORY: "Do you have a history of high blood pressure?"     Yes - Usually over 160 5. MEDICINES: "Are you taking any medicines for blood pressure?" "Have you missed any doses recently?"     No - never prescribed. 6. OTHER SYMPTOMS: "Do you have any symptoms?" (e.g., blurred vision, chest pain, difficulty breathing, headache, weakness)     Brain fog 7. PREGNANCY: "Is there any chance you are pregnant?" "When was your last menstrual period?"     na  Protocols used: Blood Pressure - High-A-AH

## 2022-06-17 ENCOUNTER — Ambulatory Visit (INDEPENDENT_AMBULATORY_CARE_PROVIDER_SITE_OTHER): Payer: No Typology Code available for payment source | Admitting: Nurse Practitioner

## 2022-06-17 ENCOUNTER — Encounter: Payer: Self-pay | Admitting: Nurse Practitioner

## 2022-06-17 VITALS — BP 160/90 | HR 83 | Wt 245.0 lb

## 2022-06-17 DIAGNOSIS — I10 Essential (primary) hypertension: Secondary | ICD-10-CM

## 2022-06-17 MED ORDER — VALSARTAN-HYDROCHLOROTHIAZIDE 160-25 MG PO TABS: 1.0000 | ORAL_TABLET | Freq: Every day | ORAL | 1 refills | Status: DC

## 2022-06-17 MED ORDER — FLUTICASONE PROPIONATE 50 MCG/ACT NA SUSP
2.0000 | Freq: Every day | NASAL | 6 refills | Status: DC
Start: 2022-06-17 — End: 2023-08-16

## 2022-06-17 MED ORDER — ALBUTEROL SULFATE HFA 108 (90 BASE) MCG/ACT IN AERS: 1.0000 | INHALATION_SPRAY | Freq: Four times a day (QID) | RESPIRATORY_TRACT | 6 refills | Status: DC | PRN

## 2022-06-17 NOTE — Patient Instructions (Signed)
It was great to see you!  Start valsartan-hctz 1 tablet daily. Keep checking your blood pressure daily. Adjust your diet like we discussed, limiting salt and increasing exercise.   Let's follow-up in 2 weeks, sooner if you have concerns.  If a referral was placed today, you will be contacted for an appointment. Please note that routine referrals can sometimes take up to 3-4 weeks to process. Please call our office if you haven't heard anything after this time frame.  Take care,  Rodman Pickle, NP

## 2022-06-17 NOTE — Progress Notes (Signed)
   Established Patient Office Visit  Subjective   Patient ID: Deiondre Harrower, male    DOB: 06-16-1981  Age: 41 y.o. MRN: [id number]  Chief Complaint  Patient presents with  . Follow-up    3 mo f/u BP. Elevated BP.     HPI  {History (Optional):23778}  ROS    Objective:     BP (!) 160/108 (BP Location: Left Arm, Patient Position: Sitting, Cuff Size: Normal)   Pulse 83   Wt 245 lb (111.1 kg)   SpO2 97%   BMI 34.17 kg/m  BP Readings from Last 3 Encounters:  06/17/22 (!) 160/108  06/16/22 (!) 120/110  06/16/22 (!) 197/115      Physical Exam   No results found for any visits on 06/17/22.  {Labs (Optional):23779}  The 10-year ASCVD risk score (Arnett DK, et al., 2019) is: 4.9%    Assessment & Plan:   Problem List Items Addressed This Visit   None   No follow-ups on file.    Gerre Scull, NP

## 2022-06-18 NOTE — Assessment & Plan Note (Addendum)
Chronic, not controlled.  BP today 160/90.  He is becoming more serious about adjusting his diet and increasing exercise.  We will start him on valsartan-hydrochlorothiazide 160-25mg  daily.  BMP and CBC reviewed from the ER.  Discussed limiting salt in his diet.  Follow-up in 2 to 3 weeks.

## 2022-06-19 ENCOUNTER — Encounter: Payer: Self-pay | Admitting: Nurse Practitioner

## 2022-06-19 ENCOUNTER — Ambulatory Visit: Payer: No Typology Code available for payment source | Admitting: Nurse Practitioner

## 2022-07-01 NOTE — Progress Notes (Unsigned)
   Established Patient Office Visit  Subjective   Patient ID: Joshua Galloway, male    DOB: November 04, 1980  Age: 41 y.o. MRN: [id number]  No chief complaint on file.   HPI  Joshua Galloway is here to follow-up on hypertension. Last visit he was started on valsartan-hctz 160-25mg  daily.   {History (Optional):23778}  ROS    Objective:     There were no vitals taken for this visit. {Vitals History (Optional):23777}  Physical Exam   No results found for any visits on 07/02/22.  {Labs (Optional):23779}  The 10-year ASCVD risk score (Arnett DK, et al., 2019) is: 8.3%    Assessment & Plan:   Problem List Items Addressed This Visit   None   No follow-ups on file.    Gerre Scull, NP

## 2022-07-02 ENCOUNTER — Encounter: Payer: Self-pay | Admitting: Nurse Practitioner

## 2022-07-02 ENCOUNTER — Ambulatory Visit: Payer: No Typology Code available for payment source | Admitting: Nurse Practitioner

## 2022-07-02 VITALS — BP 116/78 | HR 69 | Temp 97.3°F | Wt 245.0 lb

## 2022-07-02 DIAGNOSIS — M546 Pain in thoracic spine: Secondary | ICD-10-CM | POA: Diagnosis not present

## 2022-07-02 DIAGNOSIS — I10 Essential (primary) hypertension: Secondary | ICD-10-CM | POA: Diagnosis not present

## 2022-07-02 DIAGNOSIS — Z23 Encounter for immunization: Secondary | ICD-10-CM

## 2022-07-02 LAB — BASIC METABOLIC PANEL
BUN: 18 mg/dL (ref 6–23)
CO2: 30 mEq/L (ref 19–32)
Calcium: 9.1 mg/dL (ref 8.4–10.5)
Chloride: 99 mEq/L (ref 96–112)
Creatinine, Ser: 1.03 mg/dL (ref 0.40–1.50)
GFR: 89.73 mL/min (ref >60.00)
Glucose, Bld: 96 mg/dL (ref 70–99)
Potassium: 4 mEq/L (ref 3.5–5.1)
Sodium: 138 mEq/L (ref 135–145)

## 2022-07-02 MED ORDER — VALSARTAN-HYDROCHLOROTHIAZIDE 160-25 MG PO TABS
1.0000 | ORAL_TABLET | Freq: Every day | ORAL | 1 refills | Status: DC
Start: 2022-07-02 — End: 2023-02-01

## 2022-07-02 NOTE — Patient Instructions (Signed)
It was great to see you!  Keep taking the blood pressure medication daily. We are checking your kidneys today.   I have attached some stretches to do for your back daily.   Start vitamin D supplement 2,000 units daily with food.   Let's follow-up in 6 months, sooner if you have concerns.  If a referral was placed today, you will be contacted for an appointment. Please note that routine referrals can sometimes take up to 3-4 weeks to process. Please call our office if you haven't heard anything after this time frame.  Take care,  Rodman Pickle, NP

## 2022-07-02 NOTE — Assessment & Plan Note (Signed)
Chronic, stable.  Blood pressure has improved since starting medication.  BP today 116/78.  Check BMP.  Continue valsartan-hydrochlorothiazide 160-25 mg daily.  Refill sent to the pharmacy.  Follow-up in 6 months.

## 2022-07-02 NOTE — Assessment & Plan Note (Signed)
He has been experiencing acute thoracic back pain for the last 2 weeks.  He states that it happens when he is sitting without his back supported.  Checking BMP as he just started valsartan hydrochlorothiazide.  We will have him start stretches daily to help strengthen his core muscles around his back.  He can use ibuprofen, heat as needed for pain.  Follow-up if symptoms worsen or do not improve.

## 2022-08-02 HISTORY — PX: OTHER SURGICAL HISTORY: SHX169

## 2022-08-16 ENCOUNTER — Other Ambulatory Visit: Payer: Self-pay

## 2022-08-16 DIAGNOSIS — J45909 Unspecified asthma, uncomplicated: Secondary | ICD-10-CM | POA: Insufficient documentation

## 2022-08-16 DIAGNOSIS — Z79899 Other long term (current) drug therapy: Secondary | ICD-10-CM | POA: Insufficient documentation

## 2022-08-16 DIAGNOSIS — I10 Essential (primary) hypertension: Secondary | ICD-10-CM | POA: Insufficient documentation

## 2022-08-16 DIAGNOSIS — I720 Aneurysm of carotid artery: Secondary | ICD-10-CM | POA: Insufficient documentation

## 2022-08-16 DIAGNOSIS — I609 Nontraumatic subarachnoid hemorrhage, unspecified: Secondary | ICD-10-CM | POA: Diagnosis not present

## 2022-08-16 DIAGNOSIS — R519 Headache, unspecified: Secondary | ICD-10-CM | POA: Diagnosis present

## 2022-08-16 LAB — CBC WITH DIFFERENTIAL/PLATELET
Abs Immature Granulocytes: 0.03 10*3/uL (ref 0.00–0.07)
Basophils Absolute: 0 10*3/uL (ref 0.0–0.1)
Basophils Relative: 0 %
Eosinophils Absolute: 0.2 10*3/uL (ref 0.0–0.5)
Eosinophils Relative: 2 %
HCT: 39.9 % (ref 39.0–52.0)
Hemoglobin: 13.4 g/dL (ref 13.0–17.0)
Immature Granulocytes: 0 %
Lymphocytes Relative: 29 %
Lymphs Abs: 3.6 10*3/uL (ref 0.7–4.0)
MCH: 30.6 pg (ref 26.0–34.0)
MCHC: 33.6 g/dL (ref 30.0–36.0)
MCV: 91.1 fL (ref 80.0–100.0)
Monocytes Absolute: 1.2 10*3/uL — ABNORMAL HIGH (ref 0.1–1.0)
Monocytes Relative: 9 %
Neutro Abs: 7.7 10*3/uL (ref 1.7–7.7)
Neutrophils Relative %: 60 %
Platelets: 322 10*3/uL (ref 150–400)
RBC: 4.38 MIL/uL (ref 4.22–5.81)
RDW: 13.3 % (ref 11.5–15.5)
WBC: 12.7 10*3/uL — ABNORMAL HIGH (ref 4.0–10.5)
nRBC: 0 % (ref 0.0–0.2)

## 2022-08-16 LAB — BASIC METABOLIC PANEL
Anion gap: 6 (ref 5–15)
BUN: 20 mg/dL (ref 6–20)
CO2: 29 mmol/L (ref 22–32)
Calcium: 8.9 mg/dL (ref 8.9–10.3)
Chloride: 104 mmol/L (ref 98–111)
Creatinine, Ser: 1.16 mg/dL (ref 0.61–1.24)
GFR, Estimated: >60 mL/min (ref >60)
Glucose, Bld: 118 mg/dL — ABNORMAL HIGH (ref 70–99)
Potassium: 3.4 mmol/L — ABNORMAL LOW (ref 3.5–5.1)
Sodium: 139 mmol/L (ref 135–145)

## 2022-08-16 NOTE — ED Triage Notes (Addendum)
Pt states after sexual intercourse with his spouse at 2130 tonight he began to experience a sudden "worst headache of my life".  Pt states headache is gone and lasted approximately a minute. Pt states history of HTN, pt denies nausea, vomiting, photosensitvity or sensitivity to sound. Pt states he took two excedrin.

## 2022-08-17 ENCOUNTER — Emergency Department: Payer: No Typology Code available for payment source

## 2022-08-17 ENCOUNTER — Encounter: Payer: Self-pay | Admitting: Emergency Medicine

## 2022-08-17 ENCOUNTER — Emergency Department
Admission: EM | Admit: 2022-08-17 | Discharge: 2022-08-17 | Disposition: A | Discharge status: Other Acute Inpatient Hospital | Payer: No Typology Code available for payment source | Attending: Emergency Medicine | Admitting: Emergency Medicine

## 2022-08-17 DIAGNOSIS — I720 Aneurysm of carotid artery: Secondary | ICD-10-CM | POA: Diagnosis not present

## 2022-08-17 DIAGNOSIS — I618 Other nontraumatic intracerebral hemorrhage: Secondary | ICD-10-CM

## 2022-08-17 DIAGNOSIS — I671 Cerebral aneurysm, nonruptured: Secondary | ICD-10-CM

## 2022-08-17 DIAGNOSIS — I10 Essential (primary) hypertension: Secondary | ICD-10-CM | POA: Diagnosis not present

## 2022-08-17 DIAGNOSIS — I609 Nontraumatic subarachnoid hemorrhage, unspecified: Secondary | ICD-10-CM | POA: Diagnosis not present

## 2022-08-17 DIAGNOSIS — Z79899 Other long term (current) drug therapy: Secondary | ICD-10-CM | POA: Diagnosis not present

## 2022-08-17 DIAGNOSIS — J45909 Unspecified asthma, uncomplicated: Secondary | ICD-10-CM | POA: Diagnosis not present

## 2022-08-17 DIAGNOSIS — R519 Headache, unspecified: Secondary | ICD-10-CM | POA: Diagnosis present

## 2022-08-17 LAB — CSF CELL COUNT WITH DIFFERENTIAL
Eosinophils, CSF: 0 %
Eosinophils, CSF: 1 %
Lymphs, CSF: 40 %
Lymphs, CSF: 57 %
Monocyte-Macrophage-Spinal Fluid: 14 %
Monocyte-Macrophage-Spinal Fluid: 14 %
RBC Count, CSF: 1284 /mm3 — ABNORMAL HIGH (ref 0–3)
RBC Count, CSF: 1573 /mm3 — ABNORMAL HIGH (ref 0–3)
Segmented Neutrophils-CSF: 29 %
Segmented Neutrophils-CSF: 45 %
Tube #: 1
Tube #: 4
WBC, CSF: 0 /mm3 (ref 0–5)
WBC, CSF: 2 /mm3 (ref 0–5)

## 2022-08-17 LAB — PROTEIN AND GLUCOSE, CSF
Glucose, CSF: 64 mg/dL (ref 40–70)
Total  Protein, CSF: 19 mg/dL (ref 15–45)

## 2022-08-17 MED ORDER — IOHEXOL 350 MG/ML SOLN
75.0000 mL | Freq: Once | INTRAVENOUS | Status: AC | PRN
  Administered 2022-08-17: 75 mL via INTRAVENOUS

## 2022-08-17 NOTE — ED Notes (Signed)
Entered in by this RN Sam by mistake will redirect and complete under my name

## 2022-08-17 NOTE — ED Notes (Signed)
Called to [facility] for transfer per MD Ward/imaging powershared/facesheet faxed/rep:Jim

## 2022-08-17 NOTE — ED Provider Notes (Signed)
-----------------------------------------   8:08 AM on 08/17/2022 ----------------------------------------- Patient care assumed from Dr. Leonides Schanz.  I spoke with neurosurgery at [facility] who has accepted the patient to their service for further intervention.  Patient's blood pressure remains well controlled.  I have updated the patient on his acceptance to [facility] they are agreeable, currently awaiting for bed assignment.  Patient remained symptom-free without headache.   Harvest Dark, MD 08/18/22 2050

## 2022-08-17 NOTE — ED Notes (Signed)
Pt tolerated LP well. Placed in recumbent position by this RN and MD. CSF tubes labeled. Call bell within reach of patient at this time.

## 2022-08-17 NOTE — ED Provider Notes (Signed)
[facility] Provider Note    Event Date/Time   First MD Initiated Contact with Patient 08/17/22 0402     (approximate)   History   Headache   HPI  Joshua Galloway is a 41 y.o. male with history of hypertension on valsartan, asthma who presents to the emergency department after he had a sudden onset, severe headache that started immediately after sexual intercourse around 2130.  Headache lasted for about a minute and then resolved.  He took Excedrin at home and has not had any further headache.  He denies any head injury.  Not on any antiplatelets or anticoagulants.  No numbness, tingling or weakness.  No vomiting.  Has never had similar symptoms before.  No known history of intracranial hemorrhage or asthma.   History provided by patient.    Past Medical History:  Diagnosis Date   Allergy    Asthma    Chest pain    Chicken pox    Dyspnea    Heart murmur    Hypertension    Irregular heart rate    Palpitations    Prediabetes    Seasonal allergies     Past Surgical History:  Procedure Laterality Date   DENTAL SURGERY      MEDICATIONS:  Prior to Admission medications   Medication Sig Start Date End Date Taking? Authorizing Provider  albuterol (VENTOLIN HFA) 108 (90 Base) MCG/ACT inhaler Inhale 1-2 puffs into the lungs every 6 (six) hours as needed for wheezing or shortness of breath. 06/17/22   McElwee, Jake Church, NP  diphenhydrAMINE (BENADRYL) 25 mg capsule Take 25 mg by mouth every 6 (six) hours as needed for allergies.    [provider]  fluticasone (FLONASE) 50 MCG/ACT nasal spray Place 2 sprays into both nostrils daily. 06/17/22   McElwee, Lauren A, NP  valsartan-hydrochlorothiazide (DIOVAN-HCT) 160-25 MG tablet Take 1 tablet by mouth daily. 07/02/22   McElwee, Jake Church, NP  Vitamin D, Ergocalciferol, (DRISDOL) 1.25 MG (50000 UNIT) CAPS capsule Take 1 capsule (50,000 Units total) by mouth every 7 (seven) days. 03/09/22   Gerre Scull,  NP    Physical Exam   Triage Vital Signs: ED Triage Vitals  Enc Vitals Group     BP 08/16/22 2307 135/80     Pulse Rate 08/16/22 2307 90     Resp 08/16/22 2307 16     Temp 08/16/22 2307 98.6 F (37 C)     Temp Source 08/16/22 2307 Oral     SpO2 08/16/22 2307 100 %     Weight 08/16/22 2308 245 lb (111.1 kg)     Height 08/16/22 2308 5\' 11"  (1.803 m)     Head Circumference --      Peak Flow --      Pain Score 08/16/22 2307 0     Pain Loc --      Pain Edu? --      Excl. in GC? --     Most recent vital signs: Vitals:   08/16/22 2307 08/17/22 0553  BP: 135/80 127/82  Pulse: 90 71  Resp: 16 16  Temp: 98.6 F (37 C) 98.5 F (36.9 C)  SpO2: 100% 100%    CONSTITUTIONAL: Alert and oriented and responds appropriately to questions. Well-appearing; well-nourished HEAD: Normocephalic, atraumatic EYES: Conjunctivae clear, pupils appear equal, sclera nonicteric ENT: normal nose; moist mucous membranes NECK: Supple, normal ROM CARD: RRR; S1 and S2 appreciated; no murmurs, no clicks, no rubs, no gallops RESP: Normal chest  excursion without splinting or tachypnea; breath sounds clear and equal bilaterally; no wheezes, no rhonchi, no rales, no hypoxia or respiratory distress, speaking full sentences ABD/GI: Normal bowel sounds; non-distended; soft, non-tender, no rebound, no guarding, no peritoneal signs BACK: The back appears normal EXT: Normal ROM in all joints; no deformity noted, no edema; no cyanosis SKIN: Normal color for age and race; warm; no rash on exposed skin NEURO: Moves all extremities equally, normal speech, cranial nerves II through XII intact, normal sensation diffusely, normal gait PSYCH: The patient's mood and manner are appropriate.   ED Results / Procedures / Treatments   LABS: (all labs ordered are listed, but only abnormal results are displayed) Labs Reviewed  CBC WITH DIFFERENTIAL/PLATELET - Abnormal; Notable for the following components:      Result Value    WBC 12.7 (*)    Monocytes Absolute 1.2 (*)    All other components within normal limits  BASIC METABOLIC PANEL - Abnormal; Notable for the following components:   Potassium 3.4 (*)    Glucose, Bld 118 (*)    All other components within normal limits  CSF CELL COUNT WITH DIFFERENTIAL - Abnormal; Notable for the following components:   Color, CSF PINK (*)    RBC Count, CSF 1,573 (*)    All other components within normal limits  CSF CELL COUNT WITH DIFFERENTIAL - Abnormal; Notable for the following components:   Color, CSF PINK (*)    Appearance, CSF CLEAR (*)    RBC Count, CSF 1,284 (*)    All other components within normal limits  CSF CULTURE W GRAM STAIN  PROTEIN AND GLUCOSE, CSF     EKG:  RADIOLOGY: My personal review and interpretation of imaging: CTA head shows 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  I have personally reviewed all radiology reports.   CT Angio Head W or Wo Contrast  Result Date: 08/17/2022 CLINICAL DATA:  Post coital headache is EXAM: CT ANGIOGRAPHY HEAD TECHNIQUE: Multidetector CT imaging of the head was performed using the standard protocol during bolus administration of intravenous contrast. Multiplanar CT image reconstructions and MIPs were obtained to evaluate the vascular anatomy. RADIATION DOSE REDUCTION: This exam was performed according to the departmental dose-optimization program which includes automated exposure control, adjustment of the mA and/or kV according to patient size and/or use of iterative reconstruction technique. CONTRAST:  74mL OMNIPAQUE IOHEXOL 350 MG/ML SOLN COMPARISON:  None Available. FINDINGS: CT HEAD Brain: There is no mass, hemorrhage or extra-axial collection. The size and configuration of the ventricles and extra-axial CSF spaces are normal. The brain parenchyma is normal, without acute or chronic infarction. Vascular: No abnormal hyperdensity of the major  intracranial arteries or dural venous sinuses. No intracranial atherosclerosis. Skull: The visualized skull base, calvarium and extracranial soft tissues are normal. Sinuses/Orbits: No fluid levels or advanced mucosal thickening of the visualized paranasal sinuses. No mastoid or middle ear effusion. The orbits are normal. CTA HEAD POSTERIOR CIRCULATION: --Vertebral arteries: Normal V4 segments --Inferior cerebellar arteries: Normal. --Basilar artery: Normal. --Superior cerebellar arteries: Normal. --Posterior cerebral arteries: Normal. ANTERIOR CIRCULATION: --Intracranial internal carotid arteries: There is an inferiorly projecting aneurysm arising from the right carotid terminus, measuring 3 x 3 mm. --Anterior cerebral arteries (ACA): Normal. --Middle cerebral arteries (MCA): Normal. ANATOMIC VARIANTS: None Review of the MIP images confirms the above findings. IMPRESSION: 1. No acute hemorrhage. 2. 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 Electronically Signed   By: Deatra Robinson M.D.   On: 08/17/2022 03:52     PROCEDURES:  Critical Care performed: Yes, see critical care procedure note(s)   CRITICAL CARE Performed by: Baxter Hire Rachard Isidro   Total critical care time: 45 minutes  Critical care time was exclusive of separately billable procedures and treating other patients.  Critical care was necessary to treat or prevent imminent or life-threatening deterioration.  Critical care was time spent personally by me on the following activities: development of treatment plan with patient and/or surrogate as well as nursing, discussions with consultants, evaluation of patient's response to treatment, examination of patient, obtaining history from patient or surrogate, ordering and performing treatments and interventions, ordering and review of laboratory studies, ordering and review of radiographic studies, pulse oximetry and re-evaluation of patient's  condition.   .Lumbar Puncture  Date/Time: 08/17/2022 5:04 AM  Performed by: Terre Zabriskie, Layla Maw, DO Authorized by: Aspynn Clover, Layla Maw, DO   Consent:    Consent obtained:  Written   Consent given by:  Patient   Risks, benefits, and alternatives were discussed: yes     Risks discussed:  Bleeding, infection, pain, repeat procedure, nerve damage and headache   Alternatives discussed:  Referral and observation Universal protocol:    Procedure explained and questions answered to patient or proxy's satisfaction: yes     Relevant documents present and verified: yes     Test results available: yes     Imaging studies available: yes     Required blood products, implants, devices, and special equipment available: yes     Immediately prior to procedure a time out was called: yes     Site/side marked: yes     Patient identity confirmed:  Verbally with patient Pre-procedure details:    Procedure purpose:  Diagnostic   Preparation: Patient was prepped and draped in usual sterile fashion   Anesthesia:    Anesthesia method:  Local infiltration   Local anesthetic:  Lidocaine 1% w/o epi Procedure details:    Lumbar space:  L3-L4 interspace   Patient position:  Sitting   Needle gauge:  20   Needle type:  Spinal needle - Quincke tip   Needle length (in):  3.5   Ultrasound guidance: no     Number of attempts:  1   Fluid appearance:  Blood-tinged   Tubes of fluid:  4   Total volume (ml):  8 Post-procedure details:    Puncture site:  Direct pressure applied and adhesive bandage applied   Procedure completion:  Tolerated well, no immediate complications     IMPRESSION / MDM / ASSESSMENT AND PLAN / ED COURSE  I reviewed the triage vital signs and the nursing notes.    Patient here with sudden onset headache after sexual intercourse.  Currently asymptomatic.  The patient is on the cardiac monitor to evaluate for evidence of arrhythmia and/or significant heart rate changes.   DIFFERENTIAL  DIAGNOSIS (includes but not limited to):   Intracranial hemorrhage, ruptured aneurysm, tension headache, migraine, less likely meningitis, stroke, CVT   Patient's presentation is most consistent with acute presentation with potential threat to life or bodily function.   PLAN: We will obtain CBC, BMP, CTA head given concerning story.  Patient currently asymptomatic and blood pressure is normal.   MEDICATIONS GIVEN IN ED: Medications  iohexol (OMNIPAQUE) 350 MG/ML injection 75 mL (75 mLs Intravenous Contrast Given 08/17/22 0326)     ED COURSE: Patient's labs show mild leukocytosis of 12.7.  Normal electrolytes and renal function.  CTA head reviewed and interpreted by myself and the radiologist and shows a 3 x 3 mm aneurysm inferiorly projecting aneurysm arising from the right carotid terminus but no hemorrhage.  Will discuss with neurosurgery for recommendations.   7:13 AM  Pt's lumbar puncture was atraumatic and was easily done with 1 attempt.  It did show pink-tinged tubes that did not seem to clear.  He does have 1500 red blood cells in tube 1 and 1200 red blood cells in tube 4.  Patient wants transfer to [facility] if bed available.  Will discuss with [facility] transfer center as he will need to see a vascular neurosurgeon for possible intervention for aneurysmal bleed.  Signed out to oncoming ED physician.  Patient still hemodynamically stable with normal blood pressure and pain-free and neurologically intact.  We will keep him n.p.o.   CONSULTS: Discussed with Dr. Lacinda Axon on-call for neurosurgery.  He recommends obtaining lumbar puncture to rule out xanthochromia, intracranial hemorrhage.  He states if patient does have blood on lumbar puncture that he will need transfer to a tertiary care center for vascular intervention.  If no blood, he can follow-up with Dr. Lacinda Axon as an outpatient if is still asymptomatic and neurologically intact.   OUTSIDE RECORDS REVIEWED: Reviewed patient's last PCP note on  07/02/2022.       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   Gohan Collister, Delice Bison, DO 08/17/22 [phone]

## 2022-08-17 NOTE — ED Notes (Signed)
Received pt from night shift  Pt is resting quietly at present  Awaiting results

## 2022-08-17 NOTE — ED Notes (Signed)
EMTALA reviewed by this RN and all required documents are up to date and pt is ready for transport.

## 2022-08-17 NOTE — ED Notes (Signed)
Pt verbalized consent to transfer to [facility].

## 2022-08-17 NOTE — ED Notes (Signed)
Informed pt of transport to [facility]

## 2022-08-20 LAB — CSF CULTURE W GRAM STAIN: Culture: NO GROWTH

## 2022-10-07 ENCOUNTER — Encounter: Payer: Self-pay | Admitting: Emergency Medicine

## 2022-10-07 ENCOUNTER — Other Ambulatory Visit: Payer: Self-pay

## 2022-10-07 ENCOUNTER — Emergency Department
Admission: EM | Admit: 2022-10-07 | Discharge: 2022-10-07 | Disposition: A | Discharge status: Home / Self Care | Payer: No Typology Code available for payment source | Attending: Emergency Medicine | Admitting: Emergency Medicine

## 2022-10-07 DIAGNOSIS — I10 Essential (primary) hypertension: Secondary | ICD-10-CM | POA: Insufficient documentation

## 2022-10-07 DIAGNOSIS — R030 Elevated blood-pressure reading, without diagnosis of hypertension: Secondary | ICD-10-CM | POA: Diagnosis present

## 2022-10-07 DIAGNOSIS — Z79899 Other long term (current) drug therapy: Secondary | ICD-10-CM | POA: Insufficient documentation

## 2022-10-07 NOTE — ED Triage Notes (Addendum)
C/O 'feeling off" after a meeting.  Checked blood pressure, which was elevated 135/90.  Denies headache, vision changes.    Patient unsure if Valsartan taken today.  Patient is AAOx3.  Skin warm and dry. NAD  MAE equally and strong.  Patient appears anxious.

## 2022-10-07 NOTE — ED Provider Notes (Signed)
[facility] EMERGENCY DEPARTMENT Provider Note   CSN: [id number] Arrival date & time: 10/07/22  1753     History  Chief Complaint  Patient presents with   Hypertension    Joshua Galloway is a 41 y.o. male.  Presents to the emergency department for evaluation of hypertension.  He has a history of hypertension, recent subarachnoid hemorrhage with craniotomy and clipping of aneurysm on 08/19/2022.  Surgeries went well and has been doing well been asymptomatic up until today he felt his blood pressure was high.  Earlier today, patient left a meeting at work, he works from home.  He states he was stressed and felt like his blood pressure was a little high.  He checked his blood pressure and his blood pressure was 135/90.  He comes in the ER asymptomatic, no complaints of headache chest pain or shortness of breath.  He is uncertain if he took his blood pressure medication this morning, he typically takes hydrochlorothiazide valsartan once daily.  HPI     Home Medications Prior to Admission medications   Medication Sig Start Date End Date Taking? Authorizing Provider  albuterol (VENTOLIN HFA) 108 (90 Base) MCG/ACT inhaler Inhale 1-2 puffs into the lungs every 6 (six) hours as needed for wheezing or shortness of breath. 06/17/22   McElwee, Jake Church, NP  diphenhydrAMINE (BENADRYL) 25 mg capsule Take 25 mg by mouth every 6 (six) hours as needed for allergies.    [provider]  fluticasone (FLONASE) 50 MCG/ACT nasal spray Place 2 sprays into both nostrils daily. 06/17/22   McElwee, Lauren A, NP  valsartan-hydrochlorothiazide (DIOVAN-HCT) 160-25 MG tablet Take 1 tablet by mouth daily. 07/02/22   McElwee, Jake Church, NP  Vitamin D, Ergocalciferol, (DRISDOL) 1.25 MG (50000 UNIT) CAPS capsule Take 1 capsule (50,000 Units total) by mouth every 7 (seven) days. 03/09/22   McElwee, Jake Church, NP      Allergies    Mixed grasses and Penicillins    Review of Systems   Review of  Systems  Physical Exam Updated Vital Signs BP 119/75 (BP Location: Right Arm)   Pulse 71   Temp 97.7 F (36.5 C) (Oral)   Resp 16   Ht 5\' 11"  (1.803 m)   Wt 111 kg   SpO2 98%   BMI 34.13 kg/m  Physical Exam Constitutional:      Appearance: He is well-developed.  HENT:     Head: Normocephalic and atraumatic.     Right Ear: Ear canal normal.     Left Ear: Ear canal normal.     Mouth/Throat:     Mouth: Mucous membranes are moist.     Pharynx: No oropharyngeal exudate.  Eyes:     Extraocular Movements: Extraocular movements intact.     Conjunctiva/sclera: Conjunctivae normal.     Pupils: Pupils are equal, round, and reactive to light.  Cardiovascular:     Rate and Rhythm: Normal rate.  Pulmonary:     Effort: Pulmonary effort is normal. No respiratory distress.  Abdominal:     General: Abdomen is flat. There is no distension.     Tenderness: There is no abdominal tenderness. There is no guarding.  Musculoskeletal:        General: Normal range of motion.     Cervical back: Normal range of motion.  Skin:    General: Skin is warm.     Capillary Refill: Capillary refill takes less than 2 seconds.     Findings: No rash.  Neurological:  General: No focal deficit present.     Mental Status: He is alert and oriented to person, place, and time. Mental status is at baseline.     Cranial Nerves: No cranial nerve deficit.     Motor: No weakness.     Gait: Gait normal.  Psychiatric:        Behavior: Behavior normal.        Thought Content: Thought content normal.     ED Results / Procedures / Treatments   Labs (all labs ordered are listed, but only abnormal results are displayed) Labs Reviewed - No data to display  EKG None  Radiology No results found.  Procedures Procedures    Medications Ordered in ED Medications - No data to display  ED Course/ Medical Decision Making/ A&P                           Medical Decision Making  41 year old male with  concerns of hypertension earlier today while working from home, states he had a stressful conversation and felt like his blood pressure was little high.  Denies having a headache, vision changes, chest pain or shortness of breath but had a feeling that his pressure was high, he took his blood pressure at home and it was 130/90.  He was uncertain if he took his blood pressure medicine valsartan, HCTZ today so he decided to come to the ER.  He has been asymptomatic in the ED and he has a completely normal neuro exam with no complaints of headache, vision changes.  Blood pressure has been checked and is 117/75.  Patient will continue to monitor blood pressure at home, he will follow-up for any concerning symptoms or any urgent changes in his health.    Final Clinical Impression(s) / ED Diagnoses Final diagnoses:  Primary hypertension    Rx / DC Orders ED Discharge Orders     None         Renata Caprice 10/07/22 1933    Carrie Mew, MD 10/07/22 2325

## 2022-10-08 ENCOUNTER — Telehealth: Payer: Self-pay

## 2022-10-08 NOTE — Telephone Encounter (Signed)
Transition Care Management Follow-up Telephone Call Date of discharge and from where: 10/07/22 [facility] Ed.Dx: HTN How have you been since you were released from the hospital? I'm doing much better today Any questions or concerns? No  Items Reviewed: Did the pt receive and understand the discharge instructions provided? Yes  Medications obtained and verified? Yes  Other? No  Any new allergies since your discharge? No  Dietary orders reviewed? No Do you have support at home? Yes   Home Care and Equipment/Supplies: Were home health services ordered? not applicable If so, what is the name of the agency? N/a  Has the agency set up a time to come to the patient's home? not applicable Were any new equipment or medical supplies ordered?  No What is the name of the medical supply agency? N/a Were you able to get the supplies/equipment? not applicable Do you have any questions related to the use of the equipment or supplies? No  Functional Questionnaire: (I = Independent and D = Dependent) ADLs: I  Bathing/Dressing- I  Meal Prep- I  Eating- I  Maintaining continence- I  Transferring/Ambulation- I  Managing Meds- I  Follow up appointments reviewed:  PCP Hospital f/u appt confirmed? Yes  Scheduled to see Rodman Pickle on 10/12/22 @ 11:20am. Specialist Hospital f/u appt confirmed? No  Scheduled to see n/a on n/a @ n/a. Are transportation arrangements needed? No  If their condition worsens, is the pt aware to call PCP or go to the Emergency Dept.? Yes Was the patient provided with contact information for the PCP's office or ED? Yes Was to pt encouraged to call back with questions or concerns? Yes  Arvil Persons, RN, BSN RN Clinical Supervisor [employer]

## 2022-10-12 ENCOUNTER — Encounter: Payer: Self-pay | Admitting: Nurse Practitioner

## 2022-10-12 ENCOUNTER — Ambulatory Visit (INDEPENDENT_AMBULATORY_CARE_PROVIDER_SITE_OTHER): Payer: No Typology Code available for payment source | Admitting: Nurse Practitioner

## 2022-10-12 VITALS — BP 108/70 | HR 84 | Temp 98.6°F | Ht 71.0 in | Wt 237.6 lb

## 2022-10-12 DIAGNOSIS — E559 Vitamin D deficiency, unspecified: Secondary | ICD-10-CM

## 2022-10-12 DIAGNOSIS — R200 Anesthesia of skin: Secondary | ICD-10-CM

## 2022-10-12 DIAGNOSIS — R7303 Prediabetes: Secondary | ICD-10-CM | POA: Diagnosis not present

## 2022-10-12 DIAGNOSIS — R202 Paresthesia of skin: Secondary | ICD-10-CM

## 2022-10-12 DIAGNOSIS — I10 Essential (primary) hypertension: Secondary | ICD-10-CM

## 2022-10-12 LAB — VITAMIN B12: Vitamin B-12: 547 pg/mL (ref 211–911)

## 2022-10-12 LAB — COMPREHENSIVE METABOLIC PANEL
ALT: 13 U/L (ref 0–53)
AST: 12 U/L (ref 0–37)
Albumin: 4.5 g/dL (ref 3.5–5.2)
Alkaline Phosphatase: 78 U/L (ref 39–117)
BUN: 10 mg/dL (ref 6–23)
CO2: 32 mEq/L (ref 19–32)
Calcium: 10 mg/dL (ref 8.4–10.5)
Chloride: 100 mEq/L (ref 96–112)
Creatinine, Ser: 0.96 mg/dL (ref 0.40–1.50)
GFR: 97.45 mL/min (ref >60.00)
Glucose, Bld: 89 mg/dL (ref 70–99)
Potassium: 4.7 mEq/L (ref 3.5–5.1)
Sodium: 142 mEq/L (ref 135–145)
Total Bilirubin: 0.7 mg/dL (ref 0.2–1.2)
Total Protein: 7.7 g/dL (ref 6.0–8.3)

## 2022-10-12 LAB — CBC
HCT: 40.3 % (ref 39.0–52.0)
Hemoglobin: 13.6 g/dL (ref 13.0–17.0)
MCHC: 33.8 g/dL (ref 30.0–36.0)
MCV: 93.8 fl (ref 78.0–100.0)
Platelets: 369 10*3/uL (ref 150.0–400.0)
RBC: 4.29 Mil/uL (ref 4.22–5.81)
RDW: 13.9 % (ref 11.5–15.5)
WBC: 10.3 10*3/uL (ref 4.0–10.5)

## 2022-10-12 LAB — VITAMIN D 25 HYDROXY (VIT D DEFICIENCY, FRACTURES): VITD: 23.41 ng/mL — ABNORMAL LOW (ref 30.00–100.00)

## 2022-10-12 LAB — HEMOGLOBIN A1C: Hgb A1c MFr Bld: 5.7 % (ref 4.6–6.5)

## 2022-10-12 NOTE — Patient Instructions (Signed)
It was great to see you!  We are checking your labs today and will let you know the results via mychart/phone.   Keep taking your blood pressure medication. Make sure you are drinking plenty of fluids.   Start the vitamin D supplement 2,000 units daily.   Let's follow-up in 6 months, sooner if you have concerns.  If a referral was placed today, you will be contacted for an appointment. Please note that routine referrals can sometimes take up to 3-4 weeks to process. Please call our office if you haven't heard anything after this time frame.  Take care,  Rodman Pickle, NP

## 2022-10-12 NOTE — Assessment & Plan Note (Signed)
He has a history of prediabetes, his last A1c was 5.8%.  Will recheck A1c today.

## 2022-10-12 NOTE — Assessment & Plan Note (Signed)
He has a history of vitamin D deficiency and has not been taking a supplement.  Will check vitamin D levels today and treat based on results.

## 2022-10-12 NOTE — Assessment & Plan Note (Signed)
Chronic, stable.  BP today 108/70.  Continue valsartan-hydrochlorothiazide 160-25 mg daily.  Will check CMP, CBC today.  Follow-up in 6 months.

## 2022-10-12 NOTE — Progress Notes (Signed)
Established Patient Office Visit  Subjective   Patient ID: Joshua Galloway, male    DOB: 01-06-1981  Age: 41 y.o. MRN: [id number]  Chief Complaint  Patient presents with   Hospitalization Follow-up    Hosptial  follow up , having some dizziness when he gets up to fast, notice swelling on right side of face from sx  08/19/22, he has spoke with his neurologist regarding this. Having tingling in hands and legs off and on.    HPI  Joshua Galloway is here to follow-up after ER visit on 10/07/2022.  He states that on 08/17/2022 he went to the emergency room for headache and was found to have a bleeding aneurysm.  He was transferred to [facility] and had surgery done with clipping.  He states that he is doing pretty well since then, he was out of work and resting.  Last week he ended up going back to work full-time and he had a meeting and noted that his blood pressure was increasing.  It kept rising up to 160/100.  He ended up going back to the emergency room and it came back down on its own.  Since then he has been checking his blood pressure at home and it has been 110s/70s.  He has noticed some dizziness if he stands up too fast.  He also has been noticing some numbness and tingling in his fingers in his feet since having the brain surgery.  He is going to be talking to his neurosurgeon about this along with some slight swelling on his right temple.    ROS See pertinent positives and negatives per HPI.    Objective:     BP 108/70   Pulse 84   Temp 98.6 F (37 C)   Ht 5\' 11"  (1.803 m)   Wt 237 lb 9.6 oz (107.8 kg)   SpO2 97%   BMI 33.14 kg/m    Physical Exam Vitals and nursing note reviewed.  Constitutional:      Appearance: Normal appearance.  HENT:     Head: Normocephalic.  Eyes:     Conjunctiva/sclera: Conjunctivae normal.  Cardiovascular:     Rate and Rhythm: Normal rate and regular rhythm.     Pulses: Normal pulses.     Heart sounds: Normal heart sounds.  Pulmonary:      Effort: Pulmonary effort is normal.     Breath sounds: Normal breath sounds.  Musculoskeletal:     Cervical back: Normal range of motion.  Skin:    General: Skin is warm.  Neurological:     General: No focal deficit present.     Mental Status: He is alert and oriented to person, place, and time.  Psychiatric:        Mood and Affect: Mood normal.        Behavior: Behavior normal.        Thought Content: Thought content normal.        Judgment: Judgment normal.    The 10-year ASCVD risk score (Arnett DK, et al., 2019) is: 4.1%    Assessment & Plan:   Problem List Items Addressed This Visit       Cardiovascular and Mediastinum   Primary hypertension - Primary    Chronic, stable.  BP today 108/70.  Continue valsartan-hydrochlorothiazide 160-25 mg daily.  Will check CMP, CBC today.  Follow-up in 6 months.      Relevant Orders   CBC   Comprehensive metabolic panel     Other  Vitamin D deficiency    He has a history of vitamin D deficiency and has not been taking a supplement.  Will check vitamin D levels today and treat based on results.      Relevant Orders   VITAMIN D 25 Hydroxy (Vit-D Deficiency, Fractures)   Prediabetes    He has a history of prediabetes, his last A1c was 5.8%.  Will recheck A1c today.      Relevant Orders   Hemoglobin A1c   Other Visit Diagnoses     Numbness and tingling       He has been experiencing numbness and tingling in his fingers and feet since his surgery.  Will check vitamin B12 levels today.   Relevant Orders   Vitamin B12       Return in about 6 months (around 04/13/2023) for CPE.    Gerre Scull, NP

## 2023-01-01 ENCOUNTER — Ambulatory Visit: Payer: No Typology Code available for payment source | Admitting: Nurse Practitioner

## 2023-01-31 ENCOUNTER — Other Ambulatory Visit: Payer: Self-pay | Admitting: Nurse Practitioner

## 2023-02-01 NOTE — Telephone Encounter (Signed)
Last OV 10-12-22 Next OV 03-12-23  Last Refill 07-02-22

## 2023-03-11 DIAGNOSIS — Z0000 Encounter for general adult medical examination without abnormal findings: Secondary | ICD-10-CM | POA: Insufficient documentation

## 2023-03-11 DIAGNOSIS — I671 Cerebral aneurysm, nonruptured: Secondary | ICD-10-CM | POA: Insufficient documentation

## 2023-03-11 NOTE — Progress Notes (Signed)
BP 104/70 (BP Location: Left Arm)   Pulse 63   Temp (!) 97.5 F (36.4 C)   Ht 5' 11.5" (1.816 m)   Wt 252 lb 9.6 oz (114.6 kg)   SpO2 99%   BMI 34.74 kg/m    Subjective:    Patient ID: Joshua Galloway, male    DOB: 03/29/1981, 43 y.o.   MRN: [id number]  CC: Chief Complaint  Patient presents with   Numbness    Right side of scalp, tingling above right eye, had surgery 08/2022, complete paperwork    HPI: Jansel Sobek is a 42 y.o. male presenting on 03/12/2023 for comprehensive medical examination. 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 He is having continued improvement from numbness and tingling along scalp, however he noticed a sensation above his right eye similar to after his surgery.  He also has been having some allergy symptoms and has been sneezing recently.  He denies fevers, headaches, ear pain.  He currently lives with: wife, kids  Depression and Anxiety Screen done today and results listed below:     03/12/2023   10:03 AM 10/12/2022   11:23 AM 10/08/2022   11:59 AM 03/09/2022    9:55 AM 07/15/2020   11:38 AM  Depression screen PHQ 2/9  Decreased Interest 0 0 0 0 0  Down, Depressed, Hopeless 0 0 0 0 1  PHQ - 2 Score 0 0 0 0 1  Altered sleeping 1    0  Tired, decreased energy 1    1  Change in appetite 1    0  Feeling bad or failure about yourself  0    0  Trouble concentrating 0    0  Moving slowly or fidgety/restless 0    0  Suicidal thoughts 0    0  PHQ-9 Score 3    2  Difficult doing work/chores Not difficult at all    Not difficult at all      03/12/2023   10:03 AM 07/15/2020   11:39 AM  GAD 7 : Generalized Anxiety Score  Nervous, Anxious, on Edge 1 1  Control/stop worrying 0 0  Worry too much - different things 1 0  Trouble relaxing 0 0  Restless 0 0  Easily annoyed or irritable 0 1  Afraid - awful might happen 0 0  Total GAD 7 Score 2 2   Anxiety Difficulty Not difficult at all Not difficult at all    The patient does not have a history of falls. I did not complete a risk assessment for falls. A plan of care for falls was not documented.   Past Medical History:  Past Medical History:  Diagnosis Date   Allergy    Asthma    Cerebral aneurysm    Chest pain    Chicken pox    Dyspnea    Heart murmur    Hypertension    Irregular heart rate    Palpitations    Prediabetes    Seasonal allergies     Surgical History:  Past Surgical History:  Procedure Laterality Date   aneurysm clipping  08/2022   DENTAL SURGERY      Medications:  Current Outpatient Medications on File Prior to Visit  Medication Sig   albuterol (VENTOLIN HFA) 108 (90 Base) MCG/ACT inhaler Inhale 1-2 puffs into the lungs every 6 (six) hours as needed for wheezing or shortness of breath.  diphenhydrAMINE (BENADRYL) 25 mg capsule Take 25 mg by mouth every 6 (six) hours as needed for allergies.   valsartan-hydrochlorothiazide (DIOVAN-HCT) 160-25 MG tablet TAKE 1 TABLET BY MOUTH EVERY DAY   VITAMIN D PO Take by mouth daily.   acetaminophen (TYLENOL) 325 MG tablet Take by mouth. (Patient not taking: Reported on 10/12/2022)   aspirin-acetaminophen-caffeine (EXCEDRIN MIGRAINE) 250-250-65 MG tablet Take by mouth. (Patient not taking: Reported on 10/12/2022)   fluticasone (FLONASE) 50 MCG/ACT nasal spray Place 2 sprays into both nostrils daily. (Patient not taking: Reported on 03/12/2023)   melatonin (MELATONIN MAXIMUM STRENGTH) 5 MG TABS Take by mouth at bedtime.   No current facility-administered medications on file prior to visit.    Allergies:  Allergies  Allergen Reactions   Mixed Grasses    Other     Onions-break out around mouth  Cat Dander-sneezing   Penicillins     Social History:  Social History   Socioeconomic History   Marital status: Married    Spouse name: Not on file   Number of children: Not on file   Years of education:  Not on file   Highest education level: Bachelor's degree (e.g., BA, AB, BS)  Occupational History   Not on file  Tobacco Use   Smoking status: Former    Types: Cigarettes    Quit date: 03/25/2009    Years since quitting: 13.9   Smokeless tobacco: Never  Vaping Use   Vaping Use: Never used  Substance and Sexual Activity   Alcohol use: Yes    Comment: rarely    Drug use: No   Sexual activity: Yes  Other Topics Concern   Not on file  Social History Narrative   Not on file   Social Determinants of Health   Financial Resource Strain: Low Risk  (03/11/2023)   Overall Financial Resource Strain (CARDIA)    Difficulty of Paying Living Expenses: Not hard at all  Food Insecurity: No Food Insecurity (03/11/2023)   Hunger Vital Sign    Worried About Running Out of Food in the Last Year: Never true    Ran Out of Food in the Last Year: Never true  Transportation Needs: No Transportation Needs (03/11/2023)   PRAPARE - Administrator, Civil Service (Medical): No    Lack of Transportation (Non-Medical): No  Physical Activity: Unknown (03/11/2023)   Exercise Vital Sign    Days of Exercise per Week: Patient declined    Minutes of Exercise per Session: 0 min  Stress: Patient Declined (03/11/2023)   [employer] of Occupational Health - Occupational Stress Questionnaire    Feeling of Stress : Patient declined  Social Connections: Moderately Integrated (03/11/2023)   Social Connection and Isolation Panel [NHANES]    Frequency of Communication with Friends and Family: Once a week    Frequency of Social Gatherings with Friends and Family: Once a week    Attends Religious Services: More than 4 times per year    Active Member of [employer] or Organizations: Yes    Attends Banker Meetings: More than 4 times per year    Marital Status: Married  Catering manager Violence: Not At Risk (10/08/2022)   Humiliation, Afraid, Rape, and Kick questionnaire    Fear of Current or Ex-Partner: No     Emotionally Abused: No    Physically Abused: No    Sexually Abused: No   Social History   Tobacco Use  Smoking Status Former   Types: Cigarettes   Quit  date: 03/25/2009   Years since quitting: 13.9  Smokeless Tobacco Never   Social History   Substance and Sexual Activity  Alcohol Use Yes   Comment: rarely     Family History:  Family History  Problem Relation Age of Onset   Asthma Mother    Drug abuse Mother    Heart attack Father    Drug abuse Father    Early death Father    Heart disease Father    Asthma Maternal Grandmother    Cancer Maternal Grandfather     Past medical history, surgical history, medications, allergies, family history and social history reviewed with patient today and changes made to appropriate areas of the chart.   Review of Systems  Constitutional: Negative.   HENT:  Negative for congestion and ear pain.        Sneezing  Eyes: Negative.   Respiratory: Negative.    Cardiovascular: Negative.   Gastrointestinal: Negative.   Genitourinary: Negative.   Musculoskeletal: Negative.   Skin:        Sensation above right eye  Neurological: Negative.   Endo/Heme/Allergies:  Positive for environmental allergies.  Psychiatric/Behavioral:  Negative for depression. The patient is nervous/anxious (slight).    All other ROS negative except what is listed above and in the HPI.      Objective:    BP 104/70 (BP Location: Left Arm)   Pulse 63   Temp (!) 97.5 F (36.4 C)   Ht 5' 11.5" (1.816 m)   Wt 252 lb 9.6 oz (114.6 kg)   SpO2 99%   BMI 34.74 kg/m   Wt Readings from Last 3 Encounters:  03/12/23 252 lb 9.6 oz (114.6 kg)  10/12/22 237 lb 9.6 oz (107.8 kg)  10/07/22 244 lb 11.4 oz (111 kg)    Physical Exam Vitals and nursing note reviewed.  Constitutional:      General: He is not in acute distress.    Appearance: Normal appearance.  HENT:     Head: Normocephalic and atraumatic.     Right Ear: Tympanic membrane, ear canal and external  ear normal.     Left Ear: Tympanic membrane, ear canal and external ear normal.  Eyes:     Conjunctiva/sclera: Conjunctivae normal.  Cardiovascular:     Rate and Rhythm: Normal rate and regular rhythm.     Pulses: Normal pulses.     Heart sounds: Normal heart sounds.  Pulmonary:     Effort: Pulmonary effort is normal.     Breath sounds: Normal breath sounds.  Abdominal:     Palpations: Abdomen is soft.     Tenderness: There is no abdominal tenderness.  Musculoskeletal:        General: Normal range of motion.     Cervical back: Normal range of motion and neck supple. No tenderness.     Right lower leg: No edema.     Left lower leg: No edema.  Lymphadenopathy:     Cervical: No cervical adenopathy.  Skin:    General: Skin is warm and dry.  Neurological:     General: No focal deficit present.     Mental Status: He is alert and oriented to person, place, and time.     Cranial Nerves: No cranial nerve deficit.     Coordination: Coordination normal.     Gait: Gait normal.  Psychiatric:        Mood and Affect: Mood normal.        Behavior: Behavior normal.  Thought Content: Thought content normal.        Judgment: Judgment normal.     Results for orders placed or performed in visit on 10/12/22  CBC  Result Value Ref Range   WBC 10.3 4.0 - 10.5 K/uL   RBC 4.29 4.22 - 5.81 Mil/uL   Platelets 369.0 150.0 - 400.0 K/uL   Hemoglobin 13.6 13.0 - 17.0 g/dL   HCT 62.9 52.8 - 41.3 %   MCV 93.8 78.0 - 100.0 fl   MCHC 33.8 30.0 - 36.0 g/dL   RDW 24.4 01.0 - 27.2 %  Comprehensive metabolic panel  Result Value Ref Range   Sodium 142 135 - 145 mEq/L   Potassium 4.7 3.5 - 5.1 mEq/L   Chloride 100 96 - 112 mEq/L   CO2 32 19 - 32 mEq/L   Glucose, Bld 89 70 - 99 mg/dL   BUN 10 6 - 23 mg/dL   Creatinine, Ser 5.36 0.40 - 1.50 mg/dL   Total Bilirubin 0.7 0.2 - 1.2 mg/dL   Alkaline Phosphatase 78 39 - 117 U/L   AST 12 0 - 37 U/L   ALT 13 0 - 53 U/L   Total Protein 7.7 6.0 - 8.3  g/dL   Albumin 4.5 3.5 - 5.2 g/dL   GFR 64.40 >34.74 mL/min   Calcium 10.0 8.4 - 10.5 mg/dL  Vitamin Q59  Result Value Ref Range   Vitamin B-12 547 211 - 911 pg/mL  VITAMIN D 25 Hydroxy (Vit-D Deficiency, Fractures)  Result Value Ref Range   VITD 23.41 (L) 30.00 - 100.00 ng/mL  Hemoglobin A1c  Result Value Ref Range   Hgb A1c MFr Bld 5.7 4.6 - 6.5 %      Assessment & Plan:   Problem List Items Addressed This Visit       Cardiovascular and Mediastinum   Primary hypertension    Chronic, stable.  Continue valsartan-hydrochlorothiazide 160-25 mg daily.  Check CMP, CBC, lipid panel today.  Follow-up in 6 months.      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  He is doing well since surgery.  He is following with neurosurgery once a year.        Other   Vitamin D deficiency    He is currently taking an over-the-counter supplement daily, will check vitamin D levels and adjust regimen based on results.      Relevant Orders   VITAMIN D 25 Hydroxy (Vit-D Deficiency, Fractures)   Prediabetes    Chronic, stable.  Will check A1c today and adjust regimen based on results.      Relevant Orders   Hemoglobin A1c   Obesity (BMI 30-39.9)    BMI is 34.7.  He has gained about 20 pounds recently.  He states that he is interested in starting to increase his exercise again.  Encouraged him to start slowly.  Also discussed nutrition and handout given.      Routine general medical examination at a health care facility - Primary    Health maintenance reviewed and updated. Discussed nutrition, exercise. Check CMP, CBC today. Follow-up 1 year.        Seasonal allergies    Can continue Flonase as needed.  He can also start an over-the-counter Zyrtec or Claritin daily as needed as well.  IMMUNIZATIONS:   - Tdap: Tetanus  vaccination status reviewed: last tetanus booster within 10 years. - Influenza: Postponed to flu season - Pneumovax: Not applicable - Prevnar: Not applicable - HPV: Not applicable - Zostavax vaccine: Not applicable  SCREENING: - Colonoscopy: Not applicable  Discussed with patient purpose of the colonoscopy is to detect colon cancer at curable precancerous or early stages   - AAA Screening: Not applicable  -Hearing Test: Not applicable  -Spirometry: Not applicable   PATIENT COUNSELING:    Sexuality: Discussed sexually transmitted diseases, partner selection, use of condoms, avoidance of unintended pregnancy  and contraceptive alternatives.   Advised to avoid cigarette smoking.  I discussed with the patient that most people either abstain from alcohol or drink within safe limits (<=14/week and <=4 drinks/occasion for males, <=7/weeks and <= 3 drinks/occasion for females) and that the risk for alcohol disorders and other health effects rises proportionally with the number of drinks per week and how often a drinker exceeds daily limits.  Discussed cessation/primary prevention of drug use and availability of treatment for abuse.   Diet: Encouraged to adjust caloric intake to maintain  or achieve ideal body weight, to reduce intake of dietary saturated fat and total fat, to limit sodium intake by avoiding high sodium foods and not adding table salt, and to maintain adequate dietary potassium and calcium preferably from fresh fruits, vegetables, and low-fat dairy products.    stressed the importance of regular exercise  Injury prevention: Discussed safety belts, safety helmets, smoke detector, smoking near bedding or upholstery.   Dental health: Discussed importance of regular tooth brushing, flossing, and dental visits.   Follow up plan: NEXT PREVENTATIVE PHYSICAL DUE IN 1 YEAR. Return in about 6 months (around 09/12/2023) for HTN.

## 2023-03-12 ENCOUNTER — Ambulatory Visit: Payer: No Typology Code available for payment source | Admitting: Nurse Practitioner

## 2023-03-12 ENCOUNTER — Encounter: Payer: Self-pay | Admitting: Nurse Practitioner

## 2023-03-12 VITALS — BP 104/70 | HR 63 | Temp 97.5°F | Ht 71.5 in | Wt 252.6 lb

## 2023-03-12 DIAGNOSIS — E669 Obesity, unspecified: Secondary | ICD-10-CM

## 2023-03-12 DIAGNOSIS — E559 Vitamin D deficiency, unspecified: Secondary | ICD-10-CM

## 2023-03-12 DIAGNOSIS — I10 Essential (primary) hypertension: Secondary | ICD-10-CM | POA: Diagnosis not present

## 2023-03-12 DIAGNOSIS — I671 Cerebral aneurysm, nonruptured: Secondary | ICD-10-CM | POA: Diagnosis not present

## 2023-03-12 DIAGNOSIS — R7303 Prediabetes: Secondary | ICD-10-CM | POA: Diagnosis not present

## 2023-03-12 DIAGNOSIS — Z0000 Encounter for general adult medical examination without abnormal findings: Secondary | ICD-10-CM | POA: Diagnosis not present

## 2023-03-12 DIAGNOSIS — J302 Other seasonal allergic rhinitis: Secondary | ICD-10-CM | POA: Insufficient documentation

## 2023-03-12 LAB — LIPID PANEL
Cholesterol: 139 mg/dL (ref 0–200)
HDL: 42.4 mg/dL (ref >39.00)
LDL Cholesterol: 85 mg/dL (ref 0–99)
NonHDL: 96.63
Total CHOL/HDL Ratio: 3
Triglycerides: 59 mg/dL (ref 0.0–149.0)
VLDL: 11.8 mg/dL (ref 0.0–40.0)

## 2023-03-12 LAB — CBC
HCT: 40.1 % (ref 39.0–52.0)
Hemoglobin: 13.6 g/dL (ref 13.0–17.0)
MCHC: 34 g/dL (ref 30.0–36.0)
MCV: 92.3 fl (ref 78.0–100.0)
Platelets: 316 10*3/uL (ref 150.0–400.0)
RBC: 4.34 Mil/uL (ref 4.22–5.81)
RDW: 14.3 % (ref 11.5–15.5)
WBC: 9.6 10*3/uL (ref 4.0–10.5)

## 2023-03-12 LAB — HEMOGLOBIN A1C: Hgb A1c MFr Bld: 5.9 % (ref 4.6–6.5)

## 2023-03-12 LAB — COMPREHENSIVE METABOLIC PANEL
ALT: 9 U/L (ref 0–53)
AST: 12 U/L (ref 0–37)
Albumin: 4.1 g/dL (ref 3.5–5.2)
Alkaline Phosphatase: 65 U/L (ref 39–117)
BUN: 14 mg/dL (ref 6–23)
CO2: 31 mEq/L (ref 19–32)
Calcium: 9.3 mg/dL (ref 8.4–10.5)
Chloride: 101 mEq/L (ref 96–112)
Creatinine, Ser: 0.97 mg/dL (ref 0.40–1.50)
GFR: 95.97 mL/min (ref >60.00)
Glucose, Bld: 81 mg/dL (ref 70–99)
Potassium: 4 mEq/L (ref 3.5–5.1)
Sodium: 140 mEq/L (ref 135–145)
Total Bilirubin: 0.7 mg/dL (ref 0.2–1.2)
Total Protein: 7.1 g/dL (ref 6.0–8.3)

## 2023-03-12 LAB — VITAMIN D 25 HYDROXY (VIT D DEFICIENCY, FRACTURES): VITD: 26.17 ng/mL — ABNORMAL LOW (ref 30.00–100.00)

## 2023-03-12 NOTE — Assessment & Plan Note (Signed)
BMI is 34.7.  He has gained about 20 pounds recently.  He states that he is interested in starting to increase his exercise again.  Encouraged him to start slowly.  Also discussed nutrition and handout given.

## 2023-03-12 NOTE — Assessment & Plan Note (Signed)
Chronic, stable.  Continue valsartan-hydrochlorothiazide 160-25 mg daily.  Check CMP, CBC, lipid panel today.  Follow-up in 6 months.

## 2023-03-12 NOTE — Assessment & Plan Note (Signed)
He is currently taking an over-the-counter supplement daily, will check vitamin D levels and adjust regimen based on results.

## 2023-03-12 NOTE — Assessment & Plan Note (Signed)
Health maintenance reviewed and updated. Discussed nutrition, exercise. Check CMP, CBC today. Follow-up 1 year.   

## 2023-03-12 NOTE — Assessment & Plan Note (Signed)
Chronic, stable.  Will check A1c today and adjust regimen based on results.

## 2023-03-12 NOTE — Assessment & Plan Note (Signed)
Can continue Flonase as needed.  He can also start an over-the-counter Zyrtec or Claritin daily as needed as well.

## 2023-03-12 NOTE — Patient Instructions (Signed)
It was great to see you!  Keep up the great work!  Start increasing your activity slowly. [facility] is a great option if you wanted to look into this.   We are checking your labs today and will let you know the results via mychart/phone.   Let's follow-up in 6 months, sooner if you have concerns.  If a referral was placed today, you will be contacted for an appointment. Please note that routine referrals can sometimes take up to 3-4 weeks to process. Please call our office if you haven't heard anything after this time frame.  Take care,  Rodman Pickle, NP

## 2023-03-12 NOTE — Assessment & Plan Note (Signed)
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  He is doing well since surgery.  He is following with neurosurgery once a year.

## 2023-03-15 ENCOUNTER — Telehealth: Payer: Self-pay

## 2023-03-15 NOTE — Telephone Encounter (Signed)
LVM that paper work completed and faxed. I asked if patient wanted to also pick up paperwork to call back

## 2023-03-19 NOTE — Telephone Encounter (Signed)
Patient called back and did not want a copy and to place a copy in his chart.

## 2023-06-17 ENCOUNTER — Encounter (INDEPENDENT_AMBULATORY_CARE_PROVIDER_SITE_OTHER): Payer: Self-pay

## 2023-07-28 ENCOUNTER — Other Ambulatory Visit: Payer: Self-pay | Admitting: Nurse Practitioner

## 2023-07-30 ENCOUNTER — Ambulatory Visit: Payer: No Typology Code available for payment source | Admitting: Nurse Practitioner

## 2023-08-05 ENCOUNTER — Ambulatory Visit: Payer: No Typology Code available for payment source | Admitting: Nurse Practitioner

## 2023-08-05 ENCOUNTER — Encounter: Payer: Self-pay | Admitting: Nurse Practitioner

## 2023-08-05 VITALS — BP 108/70 | HR 74 | Temp 97.1°F | Ht 71.5 in | Wt 252.2 lb

## 2023-08-05 DIAGNOSIS — I10 Essential (primary) hypertension: Secondary | ICD-10-CM | POA: Diagnosis not present

## 2023-08-05 DIAGNOSIS — Z23 Encounter for immunization: Secondary | ICD-10-CM | POA: Diagnosis not present

## 2023-08-05 DIAGNOSIS — R7303 Prediabetes: Secondary | ICD-10-CM

## 2023-08-05 DIAGNOSIS — E559 Vitamin D deficiency, unspecified: Secondary | ICD-10-CM

## 2023-08-05 LAB — COMPREHENSIVE METABOLIC PANEL
ALT: 12 U/L (ref 0–53)
AST: 13 U/L (ref 0–37)
Albumin: 4.1 g/dL (ref 3.5–5.2)
Alkaline Phosphatase: 67 U/L (ref 39–117)
BUN: 13 mg/dL (ref 6–23)
CO2: 30 meq/L (ref 19–32)
Calcium: 9.4 mg/dL (ref 8.4–10.5)
Chloride: 101 meq/L (ref 96–112)
Creatinine, Ser: 1.03 mg/dL (ref 0.40–1.50)
GFR: 89.05 mL/min (ref >60.00)
Glucose, Bld: 78 mg/dL (ref 70–99)
Potassium: 4.1 meq/L (ref 3.5–5.1)
Sodium: 138 meq/L (ref 135–145)
Total Bilirubin: 0.6 mg/dL (ref 0.2–1.2)
Total Protein: 7.1 g/dL (ref 6.0–8.3)

## 2023-08-05 LAB — CBC WITH DIFFERENTIAL/PLATELET
Basophils Absolute: 0 10*3/uL (ref 0.0–0.1)
Basophils Relative: 0.4 % (ref 0.0–3.0)
Eosinophils Absolute: 0.3 10*3/uL (ref 0.0–0.7)
Eosinophils Relative: 3.3 % (ref 0.0–5.0)
HCT: 40.3 % (ref 39.0–52.0)
Hemoglobin: 13.3 g/dL (ref 13.0–17.0)
Lymphocytes Relative: 26.2 % (ref 12.0–46.0)
Lymphs Abs: 2.7 10*3/uL (ref 0.7–4.0)
MCHC: 33 g/dL (ref 30.0–36.0)
MCV: 92.4 fL (ref 78.0–100.0)
Monocytes Absolute: 1.1 10*3/uL — ABNORMAL HIGH (ref 0.1–1.0)
Monocytes Relative: 11 % (ref 3.0–12.0)
Neutro Abs: 6.1 10*3/uL (ref 1.4–7.7)
Neutrophils Relative %: 59.1 % (ref 43.0–77.0)
Platelets: 312 10*3/uL (ref 150.0–400.0)
RBC: 4.36 Mil/uL (ref 4.22–5.81)
RDW: 14.3 % (ref 11.5–15.5)
WBC: 10.3 10*3/uL (ref 4.0–10.5)

## 2023-08-05 LAB — HEMOGLOBIN A1C: Hgb A1c MFr Bld: 5.8 % (ref 4.6–6.5)

## 2023-08-05 LAB — VITAMIN D 25 HYDROXY (VIT D DEFICIENCY, FRACTURES): VITD: 36.3 ng/mL (ref 30.00–100.00)

## 2023-08-05 NOTE — Patient Instructions (Signed)
It was great to see you!  We are checking your labs today and will let you know the results via mychart/phone.   Keep taking the vitamin D and blood pressure pill daily.   Let's follow-up in 6 months, sooner if you have concerns.  If a referral was placed today, you will be contacted for an appointment. Please note that routine referrals can sometimes take up to 3-4 weeks to process. Please call our office if you haven't heard anything after this time frame.  Take care,  Rodman Pickle, NP

## 2023-08-05 NOTE — Assessment & Plan Note (Signed)
He is currently taking an over-the-counter supplement daily, will check vitamin D levels and adjust regimen based on results.

## 2023-08-05 NOTE — Progress Notes (Signed)
Established Patient Office Visit  Subjective   Patient ID: Joshua Galloway, male    DOB: [birth year]  Age: 42 y.o. MRN: [id number]  Chief Complaint  Patient presents with   Hypertension    Follow up, flu vaccine   HPI:  Gautham Michalik is here to follow-up on hypertension.   HYPERTENSION  Hypertension status: controlled  Satisfied with current treatment? yes Duration of hypertension: chronic BP monitoring frequency:  daily BP range: 100s/70s BP medication side effects:  no Medication compliance: excellent compliance Previous BP meds: valsartan-hctz Aspirin: no Recurrent headaches: no Visual changes: no Palpitations: no Dyspnea: no Chest pain: no Lower extremity edema: no Dizzy/lightheaded: no    ROS See pertinent positives and negatives per HPI.    Objective:     BP 108/70 (BP Location: Left Arm)   Pulse 74   Temp (!) 97.1 F (36.2 C)   Ht 5' 11.5" (1.816 m)   Wt 252 lb 3.2 oz (114.4 kg)   SpO2 98%   BMI 34.68 kg/m  BP Readings from Last 3 Encounters:  08/05/23 108/70  03/12/23 104/70  10/12/22 108/70   Wt Readings from Last 3 Encounters:  08/05/23 252 lb 3.2 oz (114.4 kg)  03/12/23 252 lb 9.6 oz (114.6 kg)  10/12/22 237 lb 9.6 oz (107.8 kg)      Physical Exam Vitals and nursing note reviewed.  Constitutional:      Appearance: Normal appearance.  HENT:     Head: Normocephalic.  Eyes:     Conjunctiva/sclera: Conjunctivae normal.  Cardiovascular:     Rate and Rhythm: Normal rate and regular rhythm.     Pulses: Normal pulses.     Heart sounds: Normal heart sounds.  Pulmonary:     Effort: Pulmonary effort is normal.     Breath sounds: Normal breath sounds.  Musculoskeletal:     Cervical back: Normal range of motion.  Skin:    General: Skin is warm.  Neurological:     General: No focal deficit present.     Mental Status: He is alert and oriented to person, place, and time.  Psychiatric:        Mood and Affect: Mood normal.        Behavior:  Behavior normal.        Thought Content: Thought content normal.        Judgment: Judgment normal.    The 10-year ASCVD risk score (Arnett DK, et al., 2019) is: 4.2%    Assessment & Plan:   Problem List Items Addressed This Visit       Cardiovascular and Mediastinum   Primary hypertension - Primary    Chronic, stable.  Continue valsartan-hydrochlorothiazide 160-25 mg daily.  Check CMP, CBC today.  We can possibly talk about weaning off of the blood pressure medication if his blood pressure still doing well next visit.  Follow-up 6 months.      Relevant Orders   CBC with Differential/Platelet   Comprehensive metabolic panel     Other   Vitamin D deficiency    He is currently taking an over-the-counter supplement daily, will check vitamin D levels and adjust regimen based on results.      Relevant Orders   VITAMIN D 25 Hydroxy (Vit-D Deficiency, Fractures)   Prediabetes    Chronic, stable.  Will check A1c today and adjust regimen based on results.      Relevant Orders   Hemoglobin A1c   Other Visit Diagnoses     Immunization due  Flu vaccine given today   Relevant Orders   Flu vaccine trivalent PF, 6mos and older(Flulaval,Afluria,Fluarix,Fluzone) (Completed)       Return in about 6 months (around 02/03/2024) for CPE.    Gerre Scull, NP

## 2023-08-05 NOTE — Assessment & Plan Note (Signed)
Chronic, stable.  Will check A1c today and adjust regimen based on results.

## 2023-08-05 NOTE — Assessment & Plan Note (Addendum)
Chronic, stable.  Continue valsartan-hydrochlorothiazide 160-25 mg daily.  Check CMP, CBC today.  We can possibly talk about weaning off of the blood pressure medication if his blood pressure still doing well next visit.  Follow-up 6 months.

## 2023-08-16 ENCOUNTER — Other Ambulatory Visit: Payer: Self-pay

## 2023-08-16 ENCOUNTER — Emergency Department
Admission: EM | Admit: 2023-08-16 | Discharge: 2023-08-16 | Disposition: A | Discharge status: Home / Self Care | Payer: No Typology Code available for payment source | Attending: Emergency Medicine | Admitting: Emergency Medicine

## 2023-08-16 ENCOUNTER — Emergency Department: Payer: No Typology Code available for payment source

## 2023-08-16 DIAGNOSIS — R1032 Left lower quadrant pain: Secondary | ICD-10-CM | POA: Diagnosis present

## 2023-08-16 DIAGNOSIS — N200 Calculus of kidney: Secondary | ICD-10-CM | POA: Insufficient documentation

## 2023-08-16 DIAGNOSIS — R109 Unspecified abdominal pain: Secondary | ICD-10-CM

## 2023-08-16 LAB — COMPREHENSIVE METABOLIC PANEL
ALT: 12 U/L (ref 0–44)
AST: 17 U/L (ref 15–41)
Albumin: 4.3 g/dL (ref 3.5–5.0)
Alkaline Phosphatase: 61 U/L (ref 38–126)
Anion gap: 8 (ref 5–15)
BUN: 14 mg/dL (ref 6–20)
CO2: 28 mmol/L (ref 22–32)
Calcium: 9 mg/dL (ref 8.9–10.3)
Chloride: 101 mmol/L (ref 98–111)
Creatinine, Ser: 0.98 mg/dL (ref 0.61–1.24)
GFR, Estimated: >60 mL/min (ref >60)
Glucose, Bld: 103 mg/dL — ABNORMAL HIGH (ref 70–99)
Potassium: 4.1 mmol/L (ref 3.5–5.1)
Sodium: 137 mmol/L (ref 135–145)
Total Bilirubin: 1.1 mg/dL (ref 0.3–1.2)
Total Protein: 8.2 g/dL — ABNORMAL HIGH (ref 6.5–8.1)

## 2023-08-16 LAB — CBC
HCT: 40.5 % (ref 39.0–52.0)
Hemoglobin: 13.5 g/dL (ref 13.0–17.0)
MCH: 30.6 pg (ref 26.0–34.0)
MCHC: 33.3 g/dL (ref 30.0–36.0)
MCV: 91.8 fL (ref 80.0–100.0)
Platelets: 274 10*3/uL (ref 150–400)
RBC: 4.41 MIL/uL (ref 4.22–5.81)
RDW: 14 % (ref 11.5–15.5)
WBC: 11.2 10*3/uL — ABNORMAL HIGH (ref 4.0–10.5)
nRBC: 0 % (ref 0.0–0.2)

## 2023-08-16 LAB — URINALYSIS, ROUTINE W REFLEX MICROSCOPIC
Bilirubin Urine: NEGATIVE
Glucose, UA: NEGATIVE mg/dL
Ketones, ur: 5 mg/dL — AB
Leukocytes,Ua: NEGATIVE
Nitrite: NEGATIVE
Protein, ur: NEGATIVE mg/dL
RBC / HPF: >50 RBC/hpf (ref 0–5)
Specific Gravity, Urine: 1.021 (ref 1.005–1.030)
pH: 5 (ref 5.0–8.0)

## 2023-08-16 LAB — LIPASE, BLOOD: Lipase: 26 U/L (ref 11–51)

## 2023-08-16 MED ORDER — OXYCODONE-ACETAMINOPHEN 5-325 MG PO TABS
1.0000 | ORAL_TABLET | Freq: Three times a day (TID) | ORAL | 0 refills | Status: AC | PRN
Start: 2023-08-16 — End: 2023-08-19

## 2023-08-16 MED ORDER — ONDANSETRON 4 MG PO TBDP: 4.0000 mg | ORAL_TABLET | Freq: Three times a day (TID) | ORAL | 0 refills | Status: DC | PRN

## 2023-08-16 MED ORDER — KETOROLAC TROMETHAMINE 30 MG/ML IJ SOLN
30.0000 mg | Freq: Once | INTRAMUSCULAR | Status: AC
  Administered 2023-08-16: 30 mg via INTRAMUSCULAR
  Filled 2023-08-16: qty 1

## 2023-08-16 NOTE — ED Triage Notes (Signed)
Pt comes with c/o let flank pain that radiates to groin. Pt states possible kidney stone. Pt states little vomit.

## 2023-08-16 NOTE — Discharge Instructions (Addendum)
Follow-up with your primary care provider as needed. Take the prescription meds as needed.

## 2023-08-16 NOTE — ED Provider Notes (Signed)
[facility] Provider Note    Event Date/Time   First MD Initiated Contact with Patient 08/16/23 1224     (approximate)   History   Abdominal Pain   HPI  Joshua Galloway is a 42 y.o. male with PMH of HTN, prediabetes, asthma presents for evaluation of left flank pain and left lower quadrant pain.  Patient states that his pain woke him up this morning around 1 AM.  He had severe pain into this morning.  He states he has had a kidney stone before and felt like the pain was similar.  He originally was going to come to the hospital by ambulance but when the ambulance arrived he began to feel better and felt that he could come on his own.  Since arriving in the ED patient states that he has not had any pain.  He denies any fevers, constipation and vomiting but does endorse some nausea and multiple bowel movements but not true diarrhea.  No hematochezia or melena.      Physical Exam   Triage Vital Signs: ED Triage Vitals  Encounter Vitals Group     BP 08/16/23 1108 116/82     Systolic BP Percentile --      Diastolic BP Percentile --      Pulse Rate 08/16/23 1108 64     Resp 08/16/23 1108 16     Temp 08/16/23 1108 98.1 F (36.7 C)     Temp Source 08/16/23 1108 Oral     SpO2 08/16/23 1108 97 %     Weight 08/16/23 1108 247 lb (112 kg)     Height 08/16/23 1108 5\' 11"  (1.803 m)     Head Circumference --      Peak Flow --      Pain Score 08/16/23 1118 0     Pain Loc --      Pain Education --      Exclude from Growth Chart --     Most recent vital signs: Vitals:   08/16/23 1223 08/16/23 1230  BP: 113/82 112/75  Pulse: 66 63  Resp: 18   Temp:    SpO2: 100% 100%    General: Awake, no distress.  CV:  Good peripheral perfusion.  RRR. Resp:  Normal effort.  CTAB. Abd:  No distention.  Soft, nontender, bowel sounds appropriate.   ED Results / Procedures / Treatments   Labs (all labs ordered are listed, but only abnormal results are displayed) Labs  Reviewed  COMPREHENSIVE METABOLIC PANEL - Abnormal; Notable for the following components:      Result Value   Glucose, Bld 103 (*)    Total Protein 8.2 (*)    All other components within normal limits  CBC - Abnormal; Notable for the following components:   WBC 11.2 (*)    All other components within normal limits  URINALYSIS, ROUTINE W REFLEX MICROSCOPIC - Abnormal; Notable for the following components:   Color, Urine YELLOW (*)    APPearance HAZY (*)    Hgb urine dipstick LARGE (*)    Ketones, ur 5 (*)    Bacteria, UA RARE (*)    All other components within normal limits  LIPASE, BLOOD     RADIOLOGY  CT renal stone study obtained, interpreted the images as well as reviewed the radiologist report.    PROCEDURES:  Critical Care performed: No  Procedures   MEDICATIONS ORDERED IN ED: Medications - No data to display   IMPRESSION / MDM /  ASSESSMENT AND PLAN / ED COURSE  I reviewed the triage vital signs and the nursing notes.                             42 year old male presents for evaluation of left flank and left lower quadrant pain.  Vital signs stable in triage patient NAD on exam.  Differential diagnosis includes, but is not limited to, nephrolithiasis, pyelonephritis, UTI, diverticulitis, IBS, IBD.  Patient's presentation is most consistent with acute presentation with potential threat to life or bodily function.  CMP unremarkable, CBC shows mildly elevated white count, lipase WNL.  Urinalysis notable for hemoglobin, ketones and RBCs.  CT renal stone study obtained, results are pending. If negative, patient can be discharged with PCP follow up as needed.  Patient was offered pain medication but declined stating he does not have pain anymore anywhere.   Care of the patient will be passed off to [facility], PA-C who will make the final diagnosis and plan.    FINAL CLINICAL IMPRESSION(S) / ED DIAGNOSES   Final diagnoses:  Flank pain     Rx /  DC Orders   ED Discharge Orders     None        Note:  This document was prepared using Dragon voice recognition software and may include unintentional dictation errors.   Cameron Ali, PA-C 08/16/23 1522    Phineas Semen, MD 08/17/23 [phone]

## 2023-08-16 NOTE — ED Provider Notes (Signed)
----------------------------------------- 4:00 PM on 08/16/2023 -----------------------------------------  Blood pressure 112/75, pulse 63, temperature 98.1 F (36.7 C), temperature source Oral, resp. rate 18, height 5\' 11"  (1.803 m), weight 112 kg, SpO2 100%.  Assuming care from Dr. Gwenlyn Fudge, PA-C/NP-C.  In short, Joshua Galloway is a 42 y.o. male with a chief complaint of Abdominal Pain .  Refer to the original H&P for additional details.  The current plan of care is to await pending CT results and disposition the patient accordingly.  ____________________________________________    ED Results / Procedures / Treatments   Labs (all labs ordered are listed, but only abnormal results are displayed) Labs Reviewed  COMPREHENSIVE METABOLIC PANEL - Abnormal; Notable for the following components:      Result Value   Glucose, Bld 103 (*)    Total Protein 8.2 (*)    All other components within normal limits  CBC - Abnormal; Notable for the following components:   WBC 11.2 (*)    All other components within normal limits  URINALYSIS, ROUTINE W REFLEX MICROSCOPIC - Abnormal; Notable for the following components:   Color, Urine YELLOW (*)    APPearance HAZY (*)    Hgb urine dipstick LARGE (*)    Ketones, ur 5 (*)    Bacteria, UA RARE (*)    All other components within normal limits  LIPASE, BLOOD     EKG    RADIOLOGY  I personally viewed and evaluated these images as part of my medical decision making, as well as reviewing the written report by the radiologist.  ED Provider Interpretation: Small bilateral renal calculi without evidence of hydronephrosis}  CT Renal Stone Study  Result Date: 08/16/2023 CLINICAL DATA:  Left flank radiating to groin.  Vomiting. EXAM: CT ABDOMEN AND PELVIS WITHOUT CONTRAST TECHNIQUE: Multidetector CT imaging of the abdomen and pelvis was performed following the standard protocol without IV contrast. RADIATION DOSE REDUCTION: This exam was  performed according to the departmental dose-optimization program which includes automated exposure control, adjustment of the mA and/or kV according to patient size and/or use of iterative reconstruction technique. COMPARISON:  10/04/2014 FINDINGS: Lower chest: No acute findings. Hepatobiliary: No mass visualized on this unenhanced exam. Gallbladder is unremarkable. No evidence of biliary ductal dilatation. Pancreas: No mass or inflammatory process visualized on this unenhanced exam. Spleen:  Within normal limits in size. Adrenals/Urinary tract: A few 1 mm renal calculi are seen bilaterally, best visualized on coronal series. No evidence of ureteral calculi or hydronephrosis. Unremarkable unopacified urinary bladder. Stomach/Bowel: No evidence of obstruction, inflammatory process, or abnormal fluid collections. Normal appendix visualized. Vascular/Lymphatic: No pathologically enlarged lymph nodes identified. No evidence of abdominal aortic aneurysm. Reproductive:  No mass or other significant abnormality. Other:  None. Musculoskeletal:  No suspicious bone lesions identified. IMPRESSION: Tiny bilateral renal calculi. No evidence of ureteral calculi, hydronephrosis, or other acute findings. Electronically Signed   By: Danae Orleans M.D.   On: 08/16/2023 15:38     PROCEDURES:  Critical Care performed: No  Procedures   MEDICATIONS ORDERED IN ED: Medications  ketorolac (TORADOL) 30 MG/ML injection 30 mg (30 mg Intramuscular Given 08/16/23 1552)     IMPRESSION / MDM / ASSESSMENT AND PLAN / ED COURSE  I reviewed the triage vital signs and the nursing notes.                              Differential diagnosis includes, but is not limited to, nephrolithiasis,  ureterolithiasis, hydronephrosis, pyelonephritis  Patient's presentation is most consistent with acute complicated illness / injury requiring diagnostic workup.  Patient's diagnosis is consistent with bilateral nephrolithiasis and flank pain.   Patient presentation likely representing a recently passed small renal calculus.his CT scan shows small bilateral up to 1 mm calculi.  Patient is given an IM dose of Toradol in the ED upon request.  Patient will be discharged home with prescriptions for Percocet (#9), and Zofran. Patient is to follow up with primary provider as discussed, as needed or otherwise directed. Patient is given ED precautions to return to the ED for any worsening or new symptoms.     FINAL CLINICAL IMPRESSION(S) / ED DIAGNOSES   Final diagnoses:  Flank pain  Renal calculi     Rx / DC Orders   ED Discharge Orders          Ordered    oxyCODONE-acetaminophen (PERCOCET) 5-325 MG tablet  Every 8 hours PRN        08/16/23 1550    ondansetron (ZOFRAN-ODT) 4 MG disintegrating tablet  Every 8 hours PRN        08/16/23 1550             Note:  This document was prepared using Dragon voice recognition software and may include unintentional dictation errors.    Lissa Hoard, PA-C 08/16/23 1603    Chesley Noon, MD 08/16/23 2329

## 2023-08-24 ENCOUNTER — Telehealth: Payer: Self-pay

## 2023-08-24 NOTE — Telephone Encounter (Signed)
I called and spoke with patient and he said that he had a virtual visit last night with an online physician and was prescribed Doxycycline for swelling in his foot. Patient read over possible side effects after taking 2 doses of medication and concerned with it saying it could could pressure in head and kidney issues. Patient said that he had brain surgery last year and has a history of kidney stones. He just wants to know if he should keep taking this or not.

## 2023-08-24 NOTE — Telephone Encounter (Signed)
Patient would like for the dr. To call him . Pt said he has a question about a medication he is taking

## 2023-08-24 NOTE — Telephone Encounter (Signed)
Buckson, Terrill D   TB   08/24/23  1:51 PM Note Patient would like for the dr. To call him . Pt said he has a question about a medication he is taking      Message was attached to previous refill request.

## 2023-08-25 ENCOUNTER — Telehealth: Payer: No Typology Code available for payment source | Admitting: Nurse Practitioner

## 2023-08-25 ENCOUNTER — Encounter: Payer: Self-pay | Admitting: Nurse Practitioner

## 2023-08-25 VITALS — Ht 71.0 in | Wt 247.0 lb

## 2023-08-25 DIAGNOSIS — L089 Local infection of the skin and subcutaneous tissue, unspecified: Secondary | ICD-10-CM

## 2023-08-25 MED ORDER — CEPHALEXIN 500 MG PO CAPS: 500.0000 mg | ORAL_CAPSULE | Freq: Three times a day (TID) | ORAL | 0 refills | Status: DC

## 2023-08-25 NOTE — Telephone Encounter (Signed)
Has an appointment today at 8:20am.

## 2023-08-25 NOTE — Progress Notes (Signed)
[facility] [address] [place] [address] Dept: [phone] Dept Fax: [phone]  Virtual Video Visit  I connected with Joshua Galloway on 08/25/23 at  8:20 AM EDT by a video enabled telemedicine application and verified that I am speaking with the correct person using two identifiers.  Location patient: Home Location provider: Clinic Persons participating in the virtual visit: Patient; Rodman Pickle, NP; Malena Peer, CMA  I discussed the limitations of evaluation and management by telemedicine and the availability of in person appointments. The patient expressed understanding and agreed to proceed.  Chief Complaint  Patient presents with   Medication Management    Was prescribed Doxycycline for a staph infection and had concerns with possible side effects    SUBJECTIVE:  HPI: Joshua Galloway is a 42 y.o. male who presents with possible staph infection in his right great toe.   Discussed the use of AI scribe software for clinical note transcription with the patient, who gave verbal consent to proceed.  History of Present Illness   The patient presents with a skin infection in his right big toe, which began after pulling a hangnail. He experienced throbbing pain in the toe, which prompted him to seek medical attention. He was diagnosed with a possible staph infection and started on doxycycline and mupirocin. However, he has concerns about potential side effects of doxycycline, including brain pressure and kidney issues, especially given his recent history of kidney stones and brain surgery. He reports experiencing some brain fog after starting the medication. He has previously been treated for skin infections with cephalexin without any issues.      Patient Active Problem List   Diagnosis Date Noted   Seasonal allergies 03/12/2023   Cerebral aneurysm 03/11/2023   Routine general medical examination at a health  care facility 03/11/2023   Acute midline thoracic back pain 07/02/2022   Vitamin D deficiency 03/09/2022   Prediabetes 03/09/2022   Primary hypertension 03/09/2022   LLQ abdominal pain 03/09/2022   Obesity (BMI 30-39.9) 03/09/2022    Past Surgical History:  Procedure Laterality Date   aneurysm clipping  08/2022   DENTAL SURGERY      Family History  Problem Relation Age of Onset   Asthma Mother    Drug abuse Mother    Heart attack Father    Drug abuse Father    Early death Father    Heart disease Father    Asthma Maternal Grandmother    Cancer Maternal Grandfather     Social History   Tobacco Use   Smoking status: Former    Current packs/day: 0.00    Types: Cigarettes    Quit date: 03/25/2009    Years since quitting: 14.4   Smokeless tobacco: Never  Vaping Use   Vaping status: Never Used  Substance Use Topics   Alcohol use: Yes    Comment: rarely    Drug use: No     Current Outpatient Medications:    albuterol (VENTOLIN HFA) 108 (90 Base) MCG/ACT inhaler, Inhale 1-2 puffs into the lungs every 6 (six) hours as needed for wheezing or shortness of breath., Disp: 8 g, Rfl: 6   cephALEXin (KEFLEX) 500 MG capsule, Take 1 capsule (500 mg total) by mouth 3 (three) times daily., Disp: 30 capsule, Rfl: 0   diphenhydrAMINE (BENADRYL) 25 mg capsule, Take 25 mg by mouth every 6 (six) hours as needed for allergies., Disp: , Rfl:    doxycycline (VIBRAMYCIN) 100 MG capsule, Take  100 mg by mouth 2 (two) times daily., Disp: , Rfl:    levocetirizine (XYZAL) 5 MG tablet, Take 5 mg by mouth every evening., Disp: , Rfl:    melatonin (MELATONIN MAXIMUM STRENGTH) 5 MG TABS, Take by mouth at bedtime., Disp: , Rfl:    mupirocin ointment (BACTROBAN) 2 %, Apply 1 Application topically 2 (two) times daily., Disp: , Rfl:    valsartan-hydrochlorothiazide (DIOVAN-HCT) 160-25 MG tablet, TAKE 1 TABLET BY MOUTH EVERY DAY, Disp: 90 tablet, Rfl: 1   VITAMIN D PO, Take by mouth daily., Disp: , Rfl:     ondansetron (ZOFRAN-ODT) 4 MG disintegrating tablet, Take 1 tablet (4 mg total) by mouth every 8 (eight) hours as needed for nausea or vomiting. (Patient not taking: Reported on 08/25/2023), Disp: 15 tablet, Rfl: 0  Allergies  Allergen Reactions   Mixed Grasses    Other     Onions-break out around mouth  Cat Dander-sneezing   Penicillins     ROS: See pertinent positives and negatives per HPI.  OBSERVATIONS/OBJECTIVE:  VITALS per patient if applicable: Today's Vitals   08/25/23 0821  Weight: 247 lb (112 kg)  Height: 5\' 11"  (1.803 m)   Body mass index is 34.45 kg/m.    GENERAL: Alert and oriented. Appears well and in no acute distress.  HEENT: Atraumatic. Conjunctiva clear. No obvious abnormalities on inspection of external nose and ears.  NECK: Normal movements of the head and neck.  LUNGS: On inspection, no signs of respiratory distress. Breathing rate appears normal. No obvious gross SOB, gasping or wheezing, and no conversational dyspnea.  CV: No obvious cyanosis.  MS: Moves all visible extremities without noticeable abnormality.  PSYCH/NEURO: Pleasant and cooperative. No obvious depression or anxiety. Speech and thought processing grossly intact.  ASSESSMENT AND PLAN:    Toe Infection   He presents with a possible staph infection of the right big toe following a hangnail injury. Given his concerns about doxycycline's potential side effects due to past brain surgery and recent kidney stones, we will discontinue doxycycline. He has previously tolerated cephalexin well, so we will start cephalexin 500mg , one capsule three times per day for 10 days. Mupirocin will be continued. We recommend soaking in warm Epsom salt. He should follow up if the infection does not improve after 10 days or worsens before then.       I discussed the assessment and treatment plan with the patient. The patient was provided an opportunity to ask questions and all were answered. The patient  agreed with the plan and demonstrated an understanding of the instructions.   The patient was advised to call back or seek an in-person evaluation if the symptoms worsen or if the condition fails to improve as anticipated.   Gerre Scull, NP

## 2023-08-25 NOTE — Patient Instructions (Signed)
VISIT SUMMARY:  You came in today because of a skin infection in your right big toe that started after pulling a hangnail. You were initially prescribed doxycycline and mupirocin, but due to concerns about potential side effects from doxycycline, we have made some changes to your treatment plan.  YOUR PLAN:  -TOE INFECTION: You have a possible staph infection in your right big toe, which likely started after you pulled a hangnail. Staph infections are caused by bacteria that can enter the skin through small cuts or injuries. Given your concerns about doxycycline, we have switched your antibiotic to cephalexin, which you have tolerated well in the past. You should take cephalexin 500mg , one capsule three times a day for 10 days. Continue using mupirocin as directed and soak your toe in warm Epsom salt. Please follow up if the infection does not improve after 10 days or if it worsens before then.  INSTRUCTIONS:  Please follow up if the infection does not improve after 10 days or if it worsens before then.

## 2023-08-27 ENCOUNTER — Telehealth: Payer: No Typology Code available for payment source | Admitting: Family Medicine

## 2023-08-27 DIAGNOSIS — R319 Hematuria, unspecified: Secondary | ICD-10-CM

## 2023-08-27 NOTE — Progress Notes (Signed)
Virtual Visit Consent   Joshua Galloway, you are scheduled for a virtual visit with a [facility] provider today. Just as with appointments in the office, your consent must be obtained to participate. Your consent will be active for this visit and any virtual visit you may have with one of our providers in the next 365 days. If you have a MyChart account, a copy of this consent can be sent to you electronically.  As this is a virtual visit, video technology does not allow for your provider to perform a traditional examination. This may limit your provider's ability to fully assess your condition. If your provider identifies any concerns that need to be evaluated in person or the need to arrange testing (such as labs, EKG, etc.), we will make arrangements to do so. Although advances in technology are sophisticated, we cannot ensure that it will always work on either your end or our end. If the connection with a video visit is poor, the visit may have to be switched to a telephone visit. With either a video or telephone visit, we are not always able to ensure that we have a secure connection.  By engaging in this virtual visit, you consent to the provision of healthcare and authorize for your insurance to be billed (if applicable) for the services provided during this visit. Depending on your insurance coverage, you may receive a charge related to this service.  I need to obtain your verbal consent now. Are you willing to proceed with your visit today? Joshua Galloway has provided verbal consent on 08/27/2023 for a virtual visit (video or telephone). Freddy Finner, NP  Date: 08/27/2023 10:59 AM  Virtual Visit via Video Note   I, Freddy Finner, connected with  Joshua Galloway  ([id number], [birth year]) on 08/27/23 at 11:00 AM EDT by a video-enabled telemedicine application and verified that I am speaking with the correct person using two identifiers.  Location: Patient: Virtual Visit Location Patient:  Home Provider: Virtual Visit Location Provider: Home Office   I discussed the limitations of evaluation and management by telemedicine and the availability of in person appointments. The patient expressed understanding and agreed to proceed.    History of Present Illness: Joshua Galloway is a 42 y.o. who identifies as a male who was assigned male at birth, and is being seen today for blood in urine.  Reports waking up with pain on left side- this morning that lasted a little while, but stopped hurting for a while. This was similar but not as bad as the prior weeks pain when dx with kidney stones- see note in EPIC Had blood in urine this morning- no clots. No pain at time of urination. No current pain. No fevers, chills, n/v or other signs of infection  Of note is on Keflex for skin infection as well.     Problems:  Patient Active Problem List   Diagnosis Date Noted   Seasonal allergies 03/12/2023   Cerebral aneurysm 03/11/2023   Routine general medical examination at a health care facility 03/11/2023   Acute midline thoracic back pain 07/02/2022   Vitamin D deficiency 03/09/2022   Prediabetes 03/09/2022   Primary hypertension 03/09/2022   LLQ abdominal pain 03/09/2022   Obesity (BMI 30-39.9) 03/09/2022    Allergies:  Allergies  Allergen Reactions   Mixed Grasses    Other     Onions-break out around mouth  Cat Dander-sneezing   Penicillins    Medications:  Current Outpatient Medications:  albuterol (VENTOLIN HFA) 108 (90 Base) MCG/ACT inhaler, Inhale 1-2 puffs into the lungs every 6 (six) hours as needed for wheezing or shortness of breath., Disp: 8 g, Rfl: 6   cephALEXin (KEFLEX) 500 MG capsule, Take 1 capsule (500 mg total) by mouth 3 (three) times daily., Disp: 30 capsule, Rfl: 0   diphenhydrAMINE (BENADRYL) 25 mg capsule, Take 25 mg by mouth every 6 (six) hours as needed for allergies., Disp: , Rfl:    doxycycline (VIBRAMYCIN) 100 MG capsule, Take 100 mg by mouth 2 (two)  times daily., Disp: , Rfl:    levocetirizine (XYZAL) 5 MG tablet, Take 5 mg by mouth every evening., Disp: , Rfl:    melatonin (MELATONIN MAXIMUM STRENGTH) 5 MG TABS, Take by mouth at bedtime., Disp: , Rfl:    mupirocin ointment (BACTROBAN) 2 %, Apply 1 Application topically 2 (two) times daily., Disp: , Rfl:    ondansetron (ZOFRAN-ODT) 4 MG disintegrating tablet, Take 1 tablet (4 mg total) by mouth every 8 (eight) hours as needed for nausea or vomiting. (Patient not taking: Reported on 08/25/2023), Disp: 15 tablet, Rfl: 0   valsartan-hydrochlorothiazide (DIOVAN-HCT) 160-25 MG tablet, TAKE 1 TABLET BY MOUTH EVERY DAY, Disp: 90 tablet, Rfl: 1   VITAMIN D PO, Take by mouth daily., Disp: , Rfl:   Observations/Objective: Patient is well-developed, well-nourished in no acute distress.  Resting comfortably  at home.  Head is normocephalic, atraumatic.  No labored breathing.  Speech is clear and coherent with logical content.  Patient is alert and oriented at baseline.    Assessment and Plan:  1. Hematuria, unspecified type  -discussed kidney stones as possible cause, Red flags- none today, but advised on precautions and when to return in person   Reviewed side effects, risks and benefits of medication.    Patient acknowledged agreement and understanding of the plan.   Past Medical, Surgical, Social History, Allergies, and Medications have been Reviewed.     Follow Up Instructions: I discussed the assessment and treatment plan with the patient. The patient was provided an opportunity to ask questions and all were answered. The patient agreed with the plan and demonstrated an understanding of the instructions.  A copy of instructions were sent to the patient via MyChart unless otherwise noted below.     The patient was advised to call back or seek an in-person evaluation if the symptoms worsen or if the condition fails to improve as anticipated.    Freddy Finner, NP

## 2023-08-27 NOTE — Patient Instructions (Signed)
  Joshua Galloway, thank you for joining Freddy Finner, NP for today's virtual visit.  While this provider is not your primary care provider (PCP), if your PCP is located in our provider database this encounter information will be shared with them immediately following your visit.   A [facility] MyChart account gives you access to today's visit and all your visits, tests, and labs performed at [facility] " click here if you don't have a [facility] MyChart account or go to [email or web address]  Consent: (Patient) Joshua Galloway provided verbal consent for this virtual visit at the beginning of the encounter.  Current Medications:  Current Outpatient Medications:    albuterol (VENTOLIN HFA) 108 (90 Base) MCG/ACT inhaler, Inhale 1-2 puffs into the lungs every 6 (six) hours as needed for wheezing or shortness of breath., Disp: 8 g, Rfl: 6   cephALEXin (KEFLEX) 500 MG capsule, Take 1 capsule (500 mg total) by mouth 3 (three) times daily., Disp: 30 capsule, Rfl: 0   diphenhydrAMINE (BENADRYL) 25 mg capsule, Take 25 mg by mouth every 6 (six) hours as needed for allergies., Disp: , Rfl:    doxycycline (VIBRAMYCIN) 100 MG capsule, Take 100 mg by mouth 2 (two) times daily., Disp: , Rfl:    levocetirizine (XYZAL) 5 MG tablet, Take 5 mg by mouth every evening., Disp: , Rfl:    melatonin (MELATONIN MAXIMUM STRENGTH) 5 MG TABS, Take by mouth at bedtime., Disp: , Rfl:    mupirocin ointment (BACTROBAN) 2 %, Apply 1 Application topically 2 (two) times daily., Disp: , Rfl:    ondansetron (ZOFRAN-ODT) 4 MG disintegrating tablet, Take 1 tablet (4 mg total) by mouth every 8 (eight) hours as needed for nausea or vomiting. (Patient not taking: Reported on 08/25/2023), Disp: 15 tablet, Rfl: 0   valsartan-hydrochlorothiazide (DIOVAN-HCT) 160-25 MG tablet, TAKE 1 TABLET BY MOUTH EVERY DAY, Disp: 90 tablet, Rfl: 1   VITAMIN D PO, Take by mouth daily., Disp: , Rfl:    Medications ordered in this encounter:   No orders of the defined types were placed in this encounter.    *If you need refills on other medications prior to your next appointment, please contact your pharmacy*  Follow-Up: Call back or seek an in-person evaluation if the symptoms worsen or if the condition fails to improve as anticipated.  [facility] [phone]  Other Instructions  Watch for changes in amount and type of blood Fevers Nausea and vomiting Pain that does not improve or is worsening  Seek care in person if these occur   If you have been instructed to have an in-person evaluation today at a local Urgent Care facility, please use the link below. It will take you to a list of all of our available [facility], including address, phone number and hours of operation. Please do not delay care.  [facility]  If you or a family member do not have a primary care provider, use the link below to schedule a visit and establish care. When you choose a [facility] primary care physician or advanced practice provider, you gain a long-term partner in health. Find a Primary Care Provider  Learn more about [facility]'s in-office and virtual care options: [facility] - Get Care Now

## 2023-09-17 ENCOUNTER — Ambulatory Visit: Payer: No Typology Code available for payment source | Admitting: Nurse Practitioner

## 2024-01-18 ENCOUNTER — Other Ambulatory Visit: Payer: Self-pay

## 2024-01-18 ENCOUNTER — Ambulatory Visit
Admission: RE | Admit: 2024-01-18 | Discharge: 2024-01-18 | Disposition: A | Discharge status: Home / Self Care | Source: Ambulatory Visit | Attending: Family Medicine | Admitting: Family Medicine

## 2024-01-18 VITALS — BP 125/84 | HR 71 | Temp 98.1°F | Resp 17 | Ht 71.0 in | Wt 255.0 lb

## 2024-01-18 DIAGNOSIS — M7989 Other specified soft tissue disorders: Secondary | ICD-10-CM | POA: Diagnosis not present

## 2024-01-18 NOTE — ED Provider Notes (Signed)
 [facility]    CSN: [id number] Arrival date & time: 01/18/24  0941      History   Chief Complaint Chief Complaint  Patient presents with   Leg Swelling    Saw swelling in left foot/ankle yesterday evening. Followed by some pain and tingling in leg overnight (concentrated knee to ankle before moving to thigh/hip). Has happened a separate time recently. - Entered by patient    HPI Joshua Galloway is a 43 y.o. male.   HPI Noted swelling left foot and ankle last p.m., does not recall any recent injury, was not having any pain.  Swelling was gone when he woke up this morning. Admits having a tingling sensation from the knee down during the night which resolved.  Had some left hip pain around 3 AM while sleeping on his left side. Denies recent injury, fever, chills, sweats, skin changes, redness, increased heat, numbness, weakness, back pain, recent travel, hormone therapy use.  Denies history of prior left ankle or foot fracture.  Denies history of prior edema. Past medical history includes cerebral aneurysm with repair, hypertension,prediabetes, asthma.  Medications reviewed.  Past Medical History:  Diagnosis Date   Allergy    Asthma    Cerebral aneurysm    Chest pain    Chicken pox    Dyspnea    Heart murmur    Hypertension    Irregular heart rate    Palpitations    Prediabetes    Seasonal allergies     Patient Active Problem List   Diagnosis Date Noted   Seasonal allergies 03/12/2023   Cerebral aneurysm 03/11/2023   Routine general medical examination at a health care facility 03/11/2023   Acute midline thoracic back pain 07/02/2022   Vitamin D deficiency 03/09/2022   Prediabetes 03/09/2022   Primary hypertension 03/09/2022   LLQ abdominal pain 03/09/2022   Obesity (BMI 30-39.9) 03/09/2022    Past Surgical History:  Procedure Laterality Date   aneurysm clipping  08/2022   DENTAL SURGERY         Home Medications    Prior to Admission medications    Medication Sig Start Date End Date Taking? Authorizing Provider  albuterol (VENTOLIN HFA) 108 (90 Base) MCG/ACT inhaler Inhale 1-2 puffs into the lungs every 6 (six) hours as needed for wheezing or shortness of breath. 06/17/22   McElwee, Lauren A, NP  cephALEXin (KEFLEX) 500 MG capsule Take 1 capsule (500 mg total) by mouth 3 (three) times daily. Patient not taking: Reported on 01/18/2024 08/25/23   Gerre Scull, NP  diphenhydrAMINE (BENADRYL) 25 mg capsule Take 25 mg by mouth every 6 (six) hours as needed for allergies.    [provider]  doxycycline (VIBRAMYCIN) 100 MG capsule Take 100 mg by mouth 2 (two) times daily. Patient not taking: Reported on 01/18/2024 08/23/23   [provider]  levocetirizine (XYZAL) 5 MG tablet Take 5 mg by mouth every evening. 05/03/23   [provider]  melatonin (MELATONIN MAXIMUM STRENGTH) 5 MG TABS Take by mouth at bedtime.    [provider]  mupirocin ointment (BACTROBAN) 2 % Apply 1 Application topically 2 (two) times daily. 08/23/23   [provider]  ondansetron (ZOFRAN-ODT) 4 MG disintegrating tablet Take 1 tablet (4 mg total) by mouth every 8 (eight) hours as needed for nausea or vomiting. Patient not taking: Reported on 08/25/2023 08/16/23   Menshew, Charlesetta Ivory, PA-C  valsartan-hydrochlorothiazide (DIOVAN-HCT) 160-25 MG tablet TAKE 1 TABLET BY MOUTH EVERY DAY  07/28/23   McElwee, Lauren A, NP  VITAMIN D PO Take by mouth daily.    [provider]    Family History Family History  Problem Relation Age of Onset   Asthma Mother    Drug abuse Mother    Heart attack Father    Drug abuse Father    Early death Father    Heart disease Father    Asthma Maternal Grandmother    Cancer Maternal Grandfather     Social History Social History   Tobacco Use   Smoking status: Former    Current packs/day: 0.00    Types: Cigarettes    Quit date: 03/25/2009    Years since quitting: 14.8   Smokeless  tobacco: Never  Vaping Use   Vaping status: Never Used  Substance Use Topics   Alcohol use: Yes    Comment: rarely    Drug use: No     Allergies   Mixed grasses, Other, and Penicillins   Review of Systems Review of Systems  Constitutional:  Negative for chills, fatigue and fever.  Respiratory:  Negative for chest tightness and shortness of breath.   Cardiovascular:  Positive for leg swelling. Negative for chest pain and palpitations.  Gastrointestinal:  Negative for abdominal pain, nausea and vomiting.  Skin:  Negative for color change and rash.  Neurological:  Negative for dizziness, weakness, numbness and headaches.     Physical Exam Triage Vital Signs ED Triage Vitals  Encounter Vitals Group     BP 01/18/24 0948 125/84     Systolic BP Percentile --      Diastolic BP Percentile --      Pulse Rate 01/18/24 0948 71     Resp 01/18/24 0948 17     Temp 01/18/24 0948 98.1 F (36.7 C)     Temp Source 01/18/24 0948 Oral     SpO2 01/18/24 0948 97 %     Weight 01/18/24 0948 255 lb (115.7 kg)     Height 01/18/24 0948 5\' 11"  (1.803 m)     Head Circumference --      Peak Flow --      Pain Score 01/18/24 0956 0     Pain Loc --      Pain Education --      Exclude from Growth Chart --    No data found.  Updated Vital Signs BP 125/84 (BP Location: Right Arm)   Pulse 71   Temp 98.1 F (36.7 C) (Oral)   Resp 17   Ht 5\' 11"  (1.803 m)   Wt 255 lb (115.7 kg)   SpO2 97%   BMI 35.57 kg/m   Visual Acuity Right Eye Distance:   Left Eye Distance:   Bilateral Distance:    Right Eye Near:   Left Eye Near:    Bilateral Near:     Physical Exam Vitals and nursing note reviewed.  Constitutional:      Appearance: He is not ill-appearing.  HENT:     Head: Normocephalic.  Cardiovascular:     Rate and Rhythm: Normal rate and regular rhythm.     Heart sounds: Normal heart sounds.  Pulmonary:     Effort: Pulmonary effort is normal. No respiratory distress.     Breath  sounds: Normal breath sounds. No wheezing or rales.  Abdominal:     Palpations: Abdomen is soft.  Musculoskeletal:        General: No tenderness.     Right hip: Normal.  Left hip: Normal.     Comments: Minimal pitting edema bilaterally Conferences equal bilaterally No left calf tenderness no palpable cord erythema no increased heat  Skin:    General: Skin is warm and dry.     Capillary Refill: Capillary refill takes less than 2 seconds.     Findings: No erythema.  Neurological:     Mental Status: He is alert.      UC Treatments / Results  Labs (all labs ordered are listed, but only abnormal results are displayed) Labs Reviewed - No data to display  EKG   Radiology No results found.  Procedures Procedures (including critical care time)  Medications Ordered in UC Medications - No data to display  Initial Impression / Assessment and Plan / UC Course  I have reviewed the triage vital signs and the nursing notes.  Pertinent labs & imaging results that were available during my care of the patient were reviewed by me and considered in my medical decision making (see chart for details).    43 year old male with left foot and ankle swelling yesterday which has resolved, minimal bilateral pitting edema otherwise exam normal. Recommend he follow-up with his PCP if swelling returns, go to ED for lower extremity swelling associated with redness, warmth, calf tenderness, pain chest pain, shortness of breath Final Clinical Impressions(s) / UC Diagnoses   Final diagnoses:  Swelling of left lower extremity     Discharge Instructions      Follow-up with your doctor if the swelling returns Go to the emergency department if swelling returns and is associated with calf pain or tenderness Continue current medications   ED Prescriptions   None    PDMP not reviewed this encounter.   Meliton Rattan, [place] 01/18/24 1023

## 2024-01-18 NOTE — ED Triage Notes (Addendum)
 Pt presents for left foot and ankle swelling and tingling/pain last night. States he has had the tingling/ pain on and off for a few months.   States he does sit with legs crossed at lot while sitting at his desk

## 2024-01-18 NOTE — Discharge Instructions (Addendum)
 Follow-up with your doctor if the swelling returns Go to the emergency department if swelling returns and is associated with calf pain or tenderness Continue current medications

## 2024-02-06 ENCOUNTER — Other Ambulatory Visit: Payer: Self-pay | Admitting: Nurse Practitioner

## 2024-02-07 NOTE — Telephone Encounter (Signed)
 Requesting: VALSARTAN-HCTZ 160-25 MG TAB  Last Visit: 08/05/2023 Next Visit: 02/09/2024 Last Refill: 07/28/2023  Please Advise

## 2024-02-09 ENCOUNTER — Encounter: Payer: Self-pay | Admitting: Nurse Practitioner

## 2024-02-09 ENCOUNTER — Ambulatory Visit (INDEPENDENT_AMBULATORY_CARE_PROVIDER_SITE_OTHER): Payer: No Typology Code available for payment source | Admitting: Nurse Practitioner

## 2024-02-09 VITALS — BP 118/70 | HR 81 | Temp 97.4°F | Ht 71.0 in | Wt 260.4 lb

## 2024-02-09 DIAGNOSIS — R200 Anesthesia of skin: Secondary | ICD-10-CM | POA: Diagnosis not present

## 2024-02-09 DIAGNOSIS — R7303 Prediabetes: Secondary | ICD-10-CM | POA: Diagnosis not present

## 2024-02-09 DIAGNOSIS — I10 Essential (primary) hypertension: Secondary | ICD-10-CM

## 2024-02-09 DIAGNOSIS — R202 Paresthesia of skin: Secondary | ICD-10-CM | POA: Diagnosis not present

## 2024-02-09 DIAGNOSIS — Z0000 Encounter for general adult medical examination without abnormal findings: Secondary | ICD-10-CM | POA: Diagnosis not present

## 2024-02-09 DIAGNOSIS — E559 Vitamin D deficiency, unspecified: Secondary | ICD-10-CM | POA: Diagnosis not present

## 2024-02-09 DIAGNOSIS — E669 Obesity, unspecified: Secondary | ICD-10-CM

## 2024-02-09 DIAGNOSIS — I671 Cerebral aneurysm, nonruptured: Secondary | ICD-10-CM

## 2024-02-09 LAB — COMPREHENSIVE METABOLIC PANEL WITH GFR
ALT: 14 U/L (ref 0–53)
AST: 12 U/L (ref 0–37)
Albumin: 4.2 g/dL (ref 3.5–5.2)
Alkaline Phosphatase: 64 U/L (ref 39–117)
BUN: 13 mg/dL (ref 6–23)
CO2: 32 meq/L (ref 19–32)
Calcium: 9.1 mg/dL (ref 8.4–10.5)
Chloride: 101 meq/L (ref 96–112)
Creatinine, Ser: 1.05 mg/dL (ref 0.40–1.50)
GFR: 86.7 mL/min (ref >60.00)
Glucose, Bld: 94 mg/dL (ref 70–99)
Potassium: 3.6 meq/L (ref 3.5–5.1)
Sodium: 140 meq/L (ref 135–145)
Total Bilirubin: 0.7 mg/dL (ref 0.2–1.2)
Total Protein: 7.5 g/dL (ref 6.0–8.3)

## 2024-02-09 LAB — CBC WITH DIFFERENTIAL/PLATELET
Basophils Absolute: 0 10*3/uL (ref 0.0–0.1)
Basophils Relative: 0.2 % (ref 0.0–3.0)
Eosinophils Absolute: 0.2 10*3/uL (ref 0.0–0.7)
Eosinophils Relative: 2.1 % (ref 0.0–5.0)
HCT: 41 % (ref 39.0–52.0)
Hemoglobin: 13.8 g/dL (ref 13.0–17.0)
Lymphocytes Relative: 21.3 % (ref 12.0–46.0)
Lymphs Abs: 2.2 10*3/uL (ref 0.7–4.0)
MCHC: 33.6 g/dL (ref 30.0–36.0)
MCV: 93 fl (ref 78.0–100.0)
Monocytes Absolute: 0.7 10*3/uL (ref 0.1–1.0)
Monocytes Relative: 7 % (ref 3.0–12.0)
Neutro Abs: 7.2 10*3/uL (ref 1.4–7.7)
Neutrophils Relative %: 69.4 % (ref 43.0–77.0)
Platelets: 318 10*3/uL (ref 150.0–400.0)
RBC: 4.4 Mil/uL (ref 4.22–5.81)
RDW: 14 % (ref 11.5–15.5)
WBC: 10.3 10*3/uL (ref 4.0–10.5)

## 2024-02-09 LAB — VITAMIN B12: Vitamin B-12: 463 pg/mL (ref 211–911)

## 2024-02-09 LAB — LIPID PANEL
Cholesterol: 151 mg/dL (ref 0–200)
HDL: 40.9 mg/dL (ref >39.00)
LDL Cholesterol: 95 mg/dL (ref 0–99)
NonHDL: 110.34
Total CHOL/HDL Ratio: 4
Triglycerides: 76 mg/dL (ref 0.0–149.0)
VLDL: 15.2 mg/dL (ref 0.0–40.0)

## 2024-02-09 LAB — VITAMIN D 25 HYDROXY (VIT D DEFICIENCY, FRACTURES): VITD: 36.32 ng/mL (ref 30.00–100.00)

## 2024-02-09 LAB — TSH: TSH: 0.94 u[IU]/mL (ref 0.35–5.50)

## 2024-02-09 LAB — HEMOGLOBIN A1C: Hgb A1c MFr Bld: 6 % (ref 4.6–6.5)

## 2024-02-09 MED ORDER — ALBUTEROL SULFATE HFA 108 (90 BASE) MCG/ACT IN AERS: 1.0000 | INHALATION_SPRAY | Freq: Four times a day (QID) | RESPIRATORY_TRACT | 6 refills | Status: AC | PRN

## 2024-02-09 NOTE — Assessment & Plan Note (Signed)
Health maintenance reviewed and updated. Discussed nutrition, exercise. Follow-up 1 year.

## 2024-02-09 NOTE — Assessment & Plan Note (Signed)
Chronic, stable.  Will check A1c today and adjust regimen based on results.

## 2024-02-09 NOTE — Progress Notes (Signed)
 BP 118/70 (BP Location: Left Arm, Patient Position: Sitting, Cuff Size: Large)   Pulse 81   Temp (!) 97.4 F (36.3 C)   Ht 5\' 11"  (1.803 m)   Wt 260 lb 6.4 oz (118.1 kg)   SpO2 98%   BMI 36.32 kg/m    Subjective:    Patient ID: Joshua Galloway, male    DOB: 05/28/1981, 43 y.o.   MRN: [id number]  CC: Chief Complaint  Patient presents with   Annual Exam    With fasting lab work, concerns with numbess in lower legs and left foot swelling    HPI: Joshua Galloway is a 43 y.o. male presenting on 02/09/2024 for comprehensive medical examination. Current medical complaints include: tingling in legs  Discussed the use of AI scribe software for clinical note transcription with the patient, who gave verbal consent to proceed.  History of Present Illness   The patient presents with intermittent lower extremity paresthesia, particularly when sitting on the toilet. The paresthesia is described as a sensation of the legs "going to sleep," affecting the lower part of the legs and resolving upon changing positions. The patient also reports a recent episode of unilateral foot swelling, which resolved spontaneously overnight. The patient sought evaluation at an urgent care center, where he was advised to monitor for calf pain.  The patient also discusses his recent job change, which has significantly improved his previously discussed anxiety. He is currently working as a Financial trader at an [employer] and reports feeling great since starting this new role.  The patient expresses a desire to lose weight and increase physical activity, but notes experiencing significant fatigue when attempting to be more active. He is trying to incorporate more walking and healthier eating habits into his routine, particularly given his sedentary job.  The patient also mentions a follow-up appointment for his aneurysm surgery, during which he was informed that the aneurysm is gone and he can resume normal  activities. However, he expresses concern about the fatigue he experiences when trying to increase his physical activity.  Lastly, the patient mentions occasional lower back pain and a desire to improve his posture, as he often sits hunched over. He is considering seeing a chiropractor for this issue.      He currently lives with: wife, kids  Depression and Anxiety Screen done today and results listed below:     02/09/2024    9:04 AM 08/25/2023    8:22 AM 03/12/2023   10:03 AM 10/12/2022   11:23 AM 10/08/2022   11:59 AM  Depression screen PHQ 2/9  Decreased Interest 0 0 0 0 0  Down, Depressed, Hopeless 0 0 0 0 0  PHQ - 2 Score 0 0 0 0 0  Altered sleeping 1 1 1     Tired, decreased energy 1 0 1    Change in appetite 0 0 1    Feeling bad or failure about yourself  0 0 0    Trouble concentrating 0 0 0    Moving slowly or fidgety/restless 0 0 0    Suicidal thoughts 0 0 0    PHQ-9 Score 2 1 3     Difficult doing work/chores Not difficult at all Not difficult at all Not difficult at all        02/09/2024    9:04 AM 08/25/2023    8:23 AM 03/12/2023   10:03 AM 07/15/2020   11:39 AM  GAD 7 : Generalized Anxiety Score  Nervous, Anxious, on Edge  0 1 1 1   Control/stop worrying 0 0 0 0  Worry too much - different things 0 0 1 0  Trouble relaxing 0 0 0 0  Restless 0 0 0 0  Easily annoyed or irritable 1 0 0 1  Afraid - awful might happen 0 0 0 0  Total GAD 7 Score 1 1 2 2   Anxiety Difficulty Not difficult at all Not difficult at all Not difficult at all Not difficult at all    The patient does not have a history of falls. I did not complete a risk assessment for falls. A plan of care for falls was not documented.   Past Medical History:  Past Medical History:  Diagnosis Date   Allergy    Asthma    Cerebral aneurysm    Chest pain    Chicken pox    Dyspnea    Heart murmur    Hypertension    Irregular heart rate    Palpitations    Prediabetes    Seasonal allergies     Surgical  History:  Past Surgical History:  Procedure Laterality Date   aneurysm clipping  08/2022   DENTAL SURGERY      Medications:  Current Outpatient Medications on File Prior to Visit  Medication Sig   diphenhydrAMINE (BENADRYL) 25 mg capsule Take 25 mg by mouth every 6 (six) hours as needed for allergies.   levocetirizine (XYZAL) 5 MG tablet Take 5 mg by mouth every evening.   melatonin (MELATONIN MAXIMUM STRENGTH) 5 MG TABS Take by mouth at bedtime.   valsartan-hydrochlorothiazide (DIOVAN-HCT) 160-25 MG tablet TAKE 1 TABLET BY MOUTH EVERY DAY   VITAMIN D PO Take by mouth daily.   mupirocin ointment (BACTROBAN) 2 % Apply 1 Application topically 2 (two) times daily. (Patient not taking: Reported on 02/09/2024)   ondansetron (ZOFRAN-ODT) 4 MG disintegrating tablet Take 1 tablet (4 mg total) by mouth every 8 (eight) hours as needed for nausea or vomiting. (Patient not taking: Reported on 02/09/2024)   No current facility-administered medications on file prior to visit.    Allergies:  Allergies  Allergen Reactions   Mixed Grasses    Other     Onions-break out around mouth  Cat Dander-sneezing   Penicillins     Social History:  Social History   Socioeconomic History   Marital status: Married    Spouse name: Not on file   Number of children: Not on file   Years of education: Not on file   Highest education level: Bachelor's degree (e.g., BA, AB, BS)  Occupational History   Not on file  Tobacco Use   Smoking status: Former    Current packs/day: 0.00    Types: Cigarettes    Quit date: 03/25/2009    Years since quitting: 14.8   Smokeless tobacco: Never  Vaping Use   Vaping status: Never Used  Substance and Sexual Activity   Alcohol use: Yes    Comment: rarely    Drug use: No   Sexual activity: Yes  Other Topics Concern   Not on file  Social History Narrative   Not on file   Social Drivers of Health   Financial Resource Strain: Low Risk  (02/07/2024)   Overall Financial  Resource Strain (CARDIA)    Difficulty of Paying Living Expenses: Not hard at all  Food Insecurity: No Food Insecurity (02/07/2024)   Hunger Vital Sign    Worried About Running Out of Food in the Last Year: Never true  Ran Out of Food in the Last Year: Never true  Transportation Needs: No Transportation Needs (02/07/2024)   PRAPARE - Administrator, Civil Service (Medical): No    Lack of Transportation (Non-Medical): No  Physical Activity: Insufficiently Active (02/07/2024)   Exercise Vital Sign    Days of Exercise per Week: 2 days    Minutes of Exercise per Session: 20 min  Stress: No Stress Concern Present (02/07/2024)   [employer] of Occupational Health - Occupational Stress Questionnaire    Feeling of Stress : Only a little  Social Connections: Moderately Integrated (02/07/2024)   Social Connection and Isolation Panel [NHANES]    Frequency of Communication with Friends and Family: Once a week    Frequency of Social Gatherings with Friends and Family: Once a week    Attends Religious Services: More than 4 times per year    Active Member of [employer] or Organizations: Yes    Attends Banker Meetings: Never    Marital Status: Married  Catering manager Violence: Not At Risk (10/08/2022)   Humiliation, Afraid, Rape, and Kick questionnaire    Fear of Current or Ex-Partner: No    Emotionally Abused: No    Physically Abused: No    Sexually Abused: No   Social History   Tobacco Use  Smoking Status Former   Current packs/day: 0.00   Types: Cigarettes   Quit date: 03/25/2009   Years since quitting: 14.8  Smokeless Tobacco Never   Social History   Substance and Sexual Activity  Alcohol Use Yes   Comment: rarely     Family History:  Family History  Problem Relation Age of Onset   Asthma Mother    Drug abuse Mother    Heart attack Father    Drug abuse Father    Early death Father    Heart disease Father    Asthma Maternal Grandmother    Cancer  Maternal Grandfather     Past medical history, surgical history, medications, allergies, family history and social history reviewed with patient today and changes made to appropriate areas of the chart.   Review of Systems  Constitutional:  Positive for malaise/fatigue. Negative for fever.  HENT: Negative.    Eyes: Negative.   Respiratory: Negative.    Cardiovascular: Negative.   Gastrointestinal: Negative.   Genitourinary: Negative.   Musculoskeletal: Negative.   Skin: Negative.   Neurological:  Positive for tingling (legs below knee). Negative for dizziness.  Psychiatric/Behavioral: Negative.     All other ROS negative except what is listed above and in the HPI.      Objective:    BP 118/70 (BP Location: Left Arm, Patient Position: Sitting, Cuff Size: Large)   Pulse 81   Temp (!) 97.4 F (36.3 C)   Ht 5\' 11"  (1.803 m)   Wt 260 lb 6.4 oz (118.1 kg)   SpO2 98%   BMI 36.32 kg/m   Wt Readings from Last 3 Encounters:  02/09/24 260 lb 6.4 oz (118.1 kg)  01/18/24 255 lb (115.7 kg)  08/25/23 247 lb (112 kg)    Physical Exam Vitals and nursing note reviewed.  Constitutional:      General: He is not in acute distress.    Appearance: Normal appearance.  HENT:     Head: Normocephalic and atraumatic.     Right Ear: Tympanic membrane, ear canal and external ear normal.     Left Ear: Tympanic membrane, ear canal and external ear normal.  Mouth/Throat:     Mouth: Mucous membranes are moist.     Pharynx: No posterior oropharyngeal erythema.  Eyes:     Conjunctiva/sclera: Conjunctivae normal.  Cardiovascular:     Rate and Rhythm: Normal rate and regular rhythm.     Pulses: Normal pulses.     Heart sounds: Normal heart sounds.  Pulmonary:     Effort: Pulmonary effort is normal.     Breath sounds: Normal breath sounds.  Abdominal:     Palpations: Abdomen is soft.     Tenderness: There is no abdominal tenderness.  Musculoskeletal:        General: Normal range of motion.      Cervical back: Normal range of motion and neck supple. No tenderness.     Right lower leg: No edema.     Left lower leg: No edema.  Lymphadenopathy:     Cervical: No cervical adenopathy.  Skin:    General: Skin is warm and dry.  Neurological:     General: No focal deficit present.     Mental Status: He is alert and oriented to person, place, and time.     Cranial Nerves: No cranial nerve deficit.     Coordination: Coordination normal.     Gait: Gait normal.  Psychiatric:        Mood and Affect: Mood normal.        Behavior: Behavior normal.        Thought Content: Thought content normal.        Judgment: Judgment normal.     Results for orders placed or performed during the hospital encounter of 08/16/23  Lipase, blood   Collection Time: 08/16/23 11:19 AM  Result Value Ref Range   Lipase 26 11 - 51 U/L  Comprehensive metabolic panel   Collection Time: 08/16/23 11:19 AM  Result Value Ref Range   Sodium 137 135 - 145 mmol/L   Potassium 4.1 3.5 - 5.1 mmol/L   Chloride 101 98 - 111 mmol/L   CO2 28 22 - 32 mmol/L   Glucose, Bld 103 (H) 70 - 99 mg/dL   BUN 14 6 - 20 mg/dL   Creatinine, Ser 1.61 0.61 - 1.24 mg/dL   Calcium 9.0 8.9 - 09.6 mg/dL   Total Protein 8.2 (H) 6.5 - 8.1 g/dL   Albumin 4.3 3.5 - 5.0 g/dL   AST 17 15 - 41 U/L   ALT 12 0 - 44 U/L   Alkaline Phosphatase 61 38 - 126 U/L   Total Bilirubin 1.1 0.3 - 1.2 mg/dL   GFR, Estimated >04 >54 mL/min   Anion gap 8 5 - 15  CBC   Collection Time: 08/16/23 11:19 AM  Result Value Ref Range   WBC 11.2 (H) 4.0 - 10.5 K/uL   RBC 4.41 4.22 - 5.81 MIL/uL   Hemoglobin 13.5 13.0 - 17.0 g/dL   HCT 09.8 11.9 - 14.7 %   MCV 91.8 80.0 - 100.0 fL   MCH 30.6 26.0 - 34.0 pg   MCHC 33.3 30.0 - 36.0 g/dL   RDW 82.9 56.2 - 13.0 %   Platelets 274 150 - 400 K/uL   nRBC 0.0 0.0 - 0.2 %  Urinalysis, Routine w reflex microscopic -Urine, Random   Collection Time: 08/16/23 11:19 AM  Result Value Ref Range   Color, Urine YELLOW  (A) YELLOW   APPearance HAZY (A) CLEAR   Specific Gravity, Urine 1.021 1.005 - 1.030   pH 5.0 5.0 - 8.0  Glucose, UA NEGATIVE NEGATIVE mg/dL   Hgb urine dipstick LARGE (A) NEGATIVE   Bilirubin Urine NEGATIVE NEGATIVE   Ketones, ur 5 (A) NEGATIVE mg/dL   Protein, ur NEGATIVE NEGATIVE mg/dL   Nitrite NEGATIVE NEGATIVE   Leukocytes,Ua NEGATIVE NEGATIVE   RBC / HPF >50 0 - 5 RBC/hpf   WBC, UA 0-5 0 - 5 WBC/hpf   Bacteria, UA RARE (A) NONE SEEN   Squamous Epithelial / HPF 0-5 0 - 5 /HPF   Mucus PRESENT       Assessment & Plan:   Problem List Items Addressed This Visit       Cardiovascular and Mediastinum   Primary hypertension   Chronic, stable.  Continue valsartan-hydrochlorothiazide 160-25 mg daily.  Check CMP, CBC, lipid panel today.       Relevant Orders   CBC with Differential/Platelet   Comprehensive metabolic panel with GFR   Lipid panel   Cerebral aneurysm   He had surgery on 08/19/2022 for subarachnoid hemorrhage from cerebral aneurysm with craniotomy with clipping.  He is doing well since surgery.  He is following with neurosurgery once a year.        Other   Vitamin D deficiency   He is currently taking an over-the-counter supplement daily, will check vitamin D levels and adjust regimen based on results.      Relevant Orders   VITAMIN D 25 Hydroxy (Vit-D Deficiency, Fractures)   Prediabetes   Chronic, stable.  Will check A1c today and adjust regimen based on results.      Relevant Orders   Hemoglobin A1c   Obesity (BMI 30-39.9)   Fatigue persists following aneurysm surgery, exacerbated by activity. Gradual activity increase is advised to prevent overexertion. Encourage 10-15 minutes of daily walking and a gradual increase in physical activity. Recommend a diet rich in fruits and vegetables for weight loss.      Routine general medical examination at a health care facility - Primary   Health maintenance reviewed and updated. Discussed nutrition, exercise.  Follow-up 1 year.        Numbness and tingling   Tingling in the lower legs occurs intermittently, likely due to a positional pinched nerve, and resolves with position change. Previous B12 levels were normal, making a systemic cause unlikely. Recommend stretching and yoga. Recheck vitamin B12 and TSH today.       Relevant Orders   Vitamin B12   TSH     IMMUNIZATIONS:   - Tdap: Tetanus vaccination status reviewed: last tetanus booster within 10 years. - Influenza: Postponed to flu season - Pneumovax: Not applicable - Prevnar: Not applicable - HPV: Not applicable - Shingrix vaccine: Not applicable  SCREENING: - Colonoscopy: Not applicable  Discussed with patient purpose of the colonoscopy is to detect colon cancer at curable precancerous or early stages   - AAA Screening: Not applicable   PATIENT COUNSELING:    Sexuality: Discussed sexually transmitted diseases, partner selection, use of condoms, avoidance of unintended pregnancy  and contraceptive alternatives.   Advised to avoid cigarette smoking.  I discussed with the patient that most people either abstain from alcohol or drink within safe limits (<=14/week and <=4 drinks/occasion for males, <=7/weeks and <= 3 drinks/occasion for females) and that the risk for alcohol disorders and other health effects rises proportionally with the number of drinks per week and how often a drinker exceeds daily limits.  Discussed cessation/primary prevention of drug use and availability of treatment for abuse.   Diet: Encouraged to adjust  caloric intake to maintain  or achieve ideal body weight, to reduce intake of dietary saturated fat and total fat, to limit sodium intake by avoiding high sodium foods and not adding table salt, and to maintain adequate dietary potassium and calcium preferably from fresh fruits, vegetables, and low-fat dairy products.    stressed the importance of regular exercise  Injury prevention: Discussed safety  belts, safety helmets, smoke detector, smoking near bedding or upholstery.   Dental health: Discussed importance of regular tooth brushing, flossing, and dental visits.   Follow up plan: NEXT PREVENTATIVE PHYSICAL DUE IN 1 YEAR. Return in about 1 year (around 02/08/2025) for CPE.  Kadrian Partch A Emmry Hinsch

## 2024-02-09 NOTE — Patient Instructions (Signed)
 It was great to see you!  We are checking your labs today and will let you know the results via mychart/phone.   Start exercising slowly, with walking and yoga/stretching  Let's follow-up in 1 year, sooner if you have concerns.  If a referral was placed today, you will be contacted for an appointment. Please note that routine referrals can sometimes take up to 3-4 weeks to process. Please call our office if you haven't heard anything after this time frame.  Take care,  Rodman Pickle, NP

## 2024-02-09 NOTE — Assessment & Plan Note (Signed)
 Chronic, stable Continue valsartan-hydrochlorothiazide 160-25mg  daily. Check CMP, CBC, lipid panel today.

## 2024-02-09 NOTE — Assessment & Plan Note (Signed)
He is currently taking an over-the-counter supplement daily, will check vitamin D levels and adjust regimen based on results.

## 2024-02-09 NOTE — Assessment & Plan Note (Signed)
 Fatigue persists following aneurysm surgery, exacerbated by activity. Gradual activity increase is advised to prevent overexertion. Encourage 10-15 minutes of daily walking and a gradual increase in physical activity. Recommend a diet rich in fruits and vegetables for weight loss.

## 2024-02-09 NOTE — Assessment & Plan Note (Signed)
 Tingling in the lower legs occurs intermittently, likely due to a positional pinched nerve, and resolves with position change. Previous B12 levels were normal, making a systemic cause unlikely. Recommend stretching and yoga. Recheck vitamin B12 and TSH today.

## 2024-02-09 NOTE — Assessment & Plan Note (Signed)
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  He is doing well since surgery.  He is following with neurosurgery once a year.

## 2024-05-13 ENCOUNTER — Other Ambulatory Visit: Payer: Self-pay | Admitting: Nurse Practitioner

## 2024-06-05 ENCOUNTER — Encounter: Payer: Self-pay | Admitting: Nurse Practitioner

## 2024-08-10 ENCOUNTER — Other Ambulatory Visit: Payer: Self-pay | Admitting: Nurse Practitioner

## 2024-08-11 NOTE — Telephone Encounter (Signed)
 Requesting: VALSARTAN -HCTZ 160-25 MG TAB  Last Visit: 02/09/2024 Next Visit: 02/13/2025 Last Refill: 05/15/2024  Please Advise

## 2024-10-06 ENCOUNTER — Ambulatory Visit: Admitting: Nurse Practitioner

## 2024-10-09 ENCOUNTER — Ambulatory Visit: Admitting: Nurse Practitioner

## 2024-10-11 ENCOUNTER — Ambulatory Visit: Admitting: Nurse Practitioner

## 2024-10-11 VITALS — BP 124/68 | HR 65 | Temp 97.3°F | Ht 71.0 in | Wt 256.2 lb

## 2024-10-11 DIAGNOSIS — F5101 Primary insomnia: Secondary | ICD-10-CM | POA: Insufficient documentation

## 2024-10-11 DIAGNOSIS — L659 Nonscarring hair loss, unspecified: Secondary | ICD-10-CM | POA: Diagnosis not present

## 2024-10-11 DIAGNOSIS — Z23 Encounter for immunization: Secondary | ICD-10-CM | POA: Diagnosis not present

## 2024-10-11 NOTE — Assessment & Plan Note (Signed)
 He has experienced hair thinning at the front and crown for over a year, with recent progression. Labs are normal, and there is a family history of hair loss. Start Hims shampoo and conditioner and a biotin-containing skin, hair, and nails vitamin supplement. Monitor for results over 3-4 months.

## 2024-10-11 NOTE — Assessment & Plan Note (Signed)
 He uses [facility] brand sleep aid on weeknights without dependency concerns. Continue using the sleep aid on weeknights as needed.

## 2024-10-11 NOTE — Progress Notes (Signed)
 Acute Office Visit  Subjective:     Patient ID: Joshua Galloway, male    DOB: [birth year], 43 y.o.   MRN: [id number]  Chief Complaint  Patient presents with   Hair/Scalp Problem    Concerns with thinning in crown and back of head, Flu Vaccine and Sleep Issues    HPI Discussed the use of AI scribe software for clinical note transcription with the patient, who gave verbal consent to proceed.  History of Present Illness   Joshua Galloway is a 43 year old male who presents with concerns about hair thinning and sleep aid usage.  He has noticed progressive thinning at the front and crown of his scalp for over a year, more prominent in the last couple of months. His wife has noted a distinct pattern in one area. He has not used supplements for hair loss. He is cautious about starting new treatments because of his blood pressure medication and prior brain surgery. There is hair loss in his fathers family, though details are limited. He denies scalp itching, redness, or flaking.  He uses a [employer] brand sleep aid on weekdays and does not take it on weekends, when he is able to sleep without it. He is concerned about becoming dependent on it. He previously used but has stopped melatonin. He denies chronic fatigue, daytime tiredness, or changes in nail brittleness, and has no heat or cold intolerance or persistent low energy, attributing any tiredness to staying up late.     ROS See pertinent positives and negatives per HPI.     Objective:    BP 124/68 (BP Location: Left Arm, Patient Position: Sitting, Cuff Size: Large)   Pulse 65   Temp (!) 97.3 F (36.3 C)   Ht 5' 11 (1.803 m)   Wt 256 lb 3.2 oz (116.2 kg)   SpO2 99%   BMI 35.73 kg/m    Physical Exam Vitals and nursing note reviewed.  Constitutional:      Appearance: Normal appearance.  HENT:     Head: Normocephalic.  Eyes:     Conjunctiva/sclera: Conjunctivae normal.  Cardiovascular:     Rate and Rhythm: Normal rate and regular  rhythm.     Pulses: Normal pulses.     Heart sounds: Normal heart sounds.  Pulmonary:     Effort: Pulmonary effort is normal.  Musculoskeletal:     Cervical back: Normal range of motion.  Skin:    General: Skin is warm.     Comments: Hair thinning at crown of head, no rashes or changes in skin  Neurological:     General: No focal deficit present.     Mental Status: He is alert and oriented to person, place, and time.  Psychiatric:        Mood and Affect: Mood normal.        Behavior: Behavior normal.        Thought Content: Thought content normal.        Judgment: Judgment normal.       Assessment & Plan:   Problem List Items Addressed This Visit       Other   Hair loss - Primary   He has experienced hair thinning at the front and crown for over a year, with recent progression. Labs are normal, and there is a family history of hair loss. Start Hims shampoo and conditioner and a biotin-containing skin, hair, and nails vitamin supplement. Monitor for results over 3-4 months.      Primary insomnia  He uses [employer] brand sleep aid on weeknights without dependency concerns. Continue using the sleep aid on weeknights as needed.      Other Visit Diagnoses       Immunization due       Flu vaccine given today   Relevant Orders   Flu vaccine trivalent PF, 6mos and older(Flulaval,Afluria,Fluarix,Fluzone) (Completed)      No orders of the defined types were placed in this encounter.   Return if symptoms worsen or fail to improve.  Tinnie DELENA Harada, NP

## 2024-10-11 NOTE — Patient Instructions (Signed)
 It was great to see you!  Start a skin, hair and nails supplement with biotin   Start the hims shampoo and conditioner   Let's follow-up with any concerns  Take care,  Tinnie Harada, NP

## 2025-02-13 ENCOUNTER — Encounter: Admitting: Nurse Practitioner
